# Patient Record
Sex: Female | Born: 1990
Health system: Southern US, Community
[De-identification: ages and names within clinical notes are randomized; demographics above are authoritative.]

## PROBLEM LIST (undated history)

## (undated) DIAGNOSIS — R87619 Unspecified abnormal cytological findings in specimens from cervix uteri: Secondary | ICD-10-CM

## (undated) DIAGNOSIS — I499 Cardiac arrhythmia, unspecified: Secondary | ICD-10-CM

## (undated) HISTORY — PX: BREAST SURGERY: SHX581

## (undated) HISTORY — PX: WISDOM TOOTH EXTRACTION: SHX21

---
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
## 2014-12-04 NOTE — L&D Delivery Note (Signed)
Delivery Note At 5:52 AM a viable female was delivered via Vaginal, Spontaneous Delivery (Presentation: ; Occiput Anterior).  APGAR: 8, 9; weight 7 lb 11.8 oz (3510 g).   Placenta status: Intact, Spontaneous.  Cord: 3 vessels with the following complications: None.  Cord pH: none drawn  Anesthesia: Epidural  Episiotomy: None Lacerations: 1st degree;Labial Suture Repair: 3.0 vicryl Est. Blood Loss (mL): 300  Mom to postpartum.  Baby to Couplet care / Skin to Skin.  Called to see patient.  Mom pushed to deliver viable female infant.  The head followed by shoulders, which delivered without difficulty, and the rest of the body.  A single tight nuchal cord noted and delivered through.  Baby to mom's chest.  Cord clamped and cut after > 1 min delay.  No cord blood obtained.  Placenta delivered spontaneously, intact, with a 3-vessel cord.  First degree perineal laceration repaired with 3-0 Vicryl in a single, figure-of-eight suture.  All counts correct.  Hemostasis obtained with IV pitocin and fundal massage. EBL 300 mL.    Conard Novak, MD 07/30/2015, 6:25 AM

## 2015-07-29 ENCOUNTER — Inpatient Hospital Stay: Payer: 59 | Admitting: Anesthesiology

## 2015-07-29 ENCOUNTER — Encounter: Payer: Self-pay | Admitting: Obstetrics and Gynecology

## 2015-07-29 ENCOUNTER — Other Ambulatory Visit: Payer: Self-pay | Admitting: Obstetrics and Gynecology

## 2015-07-29 ENCOUNTER — Inpatient Hospital Stay
Admission: EM | Admit: 2015-07-29 | Discharge: 2015-07-31 | DRG: 774 | Disposition: A | Discharge status: Home / Self Care | Payer: 59 | Attending: Obstetrics and Gynecology | Admitting: Obstetrics and Gynecology

## 2015-07-29 DIAGNOSIS — O9942 Diseases of the circulatory system complicating childbirth: Secondary | ICD-10-CM | POA: Diagnosis present

## 2015-07-29 DIAGNOSIS — Z833 Family history of diabetes mellitus: Secondary | ICD-10-CM | POA: Diagnosis not present

## 2015-07-29 DIAGNOSIS — Z3A39 39 weeks gestation of pregnancy: Secondary | ICD-10-CM | POA: Diagnosis present

## 2015-07-29 DIAGNOSIS — Z8249 Family history of ischemic heart disease and other diseases of the circulatory system: Secondary | ICD-10-CM

## 2015-07-29 HISTORY — DX: Unspecified abnormal cytological findings in specimens from cervix uteri: R87.619

## 2015-07-29 HISTORY — DX: Cardiac arrhythmia, unspecified: I49.9

## 2015-07-29 LAB — TYPE AND SCREEN
ABO/RH(D): A POS
ANTIBODY SCREEN: NEGATIVE

## 2015-07-29 LAB — OB RESULTS CONSOLE GBS: GBS: NEGATIVE

## 2015-07-29 LAB — CBC
HCT: 36.3 % (ref 35.0–47.0)
Hemoglobin: 12 g/dL (ref 12.0–16.0)
MCH: 28.9 pg (ref 26.0–34.0)
MCHC: 33.1 g/dL (ref 32.0–36.0)
MCV: 87.5 fL (ref 80.0–100.0)
PLATELETS: 287 10*3/uL (ref 150–440)
RBC: 4.15 MIL/uL (ref 3.80–5.20)
RDW: 13.9 % (ref 11.5–14.5)
WBC: 15.6 10*3/uL — AB (ref 3.6–11.0)

## 2015-07-29 LAB — OB RESULTS CONSOLE ABO/RH: RH Type: POSITIVE

## 2015-07-29 MED ORDER — CITRIC ACID-SODIUM CITRATE 334-500 MG/5ML PO SOLN: 30.0000 mL | ORAL | Status: DC | PRN

## 2015-07-29 MED ORDER — LACTATED RINGERS IV SOLN
INTRAVENOUS | Status: DC
  Administered 2015-07-29 – 2015-07-30 (×2): via INTRAVENOUS

## 2015-07-29 MED ORDER — OXYTOCIN BOLUS FROM INFUSION
500.0000 mL | INTRAVENOUS | Status: DC
  Administered 2015-07-30: 500 mL via INTRAVENOUS

## 2015-07-29 MED ORDER — FENTANYL CITRATE (PF) 100 MCG/2ML IJ SOLN
INTRAMUSCULAR | Status: AC
  Administered 2015-07-29: 50 ug via INTRAVENOUS
  Filled 2015-07-29: qty 2

## 2015-07-29 MED ORDER — OXYTOCIN 40 UNITS IN LACTATED RINGERS INFUSION - SIMPLE MED
62.5000 mL/h | INTRAVENOUS | Status: DC
  Filled 2015-07-29: qty 1000

## 2015-07-29 MED ORDER — ONDANSETRON HCL 4 MG/2ML IJ SOLN: 4.0000 mg | Freq: Four times a day (QID) | INTRAMUSCULAR | Status: DC | PRN

## 2015-07-29 MED ORDER — OXYTOCIN 10 UNIT/ML IJ SOLN: 10.0000 [IU] | Freq: Once | INTRAMUSCULAR | Status: DC

## 2015-07-29 MED ORDER — FENTANYL CITRATE (PF) 100 MCG/2ML IJ SOLN
50.0000 ug | Freq: Once | INTRAMUSCULAR | Status: AC
  Administered 2015-07-29: 50 ug via INTRAVENOUS

## 2015-07-29 MED ORDER — LACTATED RINGERS IV SOLN: 500.0000 mL | INTRAVENOUS | Status: DC | PRN

## 2015-07-29 MED ORDER — LIDOCAINE HCL (PF) 1 % IJ SOLN
30.0000 mL | INTRAMUSCULAR | Status: DC | PRN
Start: 2015-07-29 — End: 2015-07-30
  Filled 2015-07-29: qty 30

## 2015-07-29 NOTE — H&P (Signed)
OB History & Physical   History of Present Illness:  Chief Complaint: Contractions  HPI:  Hannah Schaefer is a 24 y.o. No obstetric history on file. female at [redacted]w[redacted]d GA dated by a 12-week ultrasound. Her pregnancy has been complicated by a short interpregnancy interval, history of LSIL pap smear.   She reports contractions on and off for the past 24 hours, getting stronger over the last four hours. She denies leakage of fluid. She denies vginal bleeding. She reports fetal movement.   Maternal Medical History:  Past Medical History: Abnormal pap smear (LSIL) Question of cardiac arrhythmia (normal EKG this pregnancy)  Past Surgical History: No surgeries  Allergies: NKDA   Medications at home  PNV   Obstetrical History: G1 05/16/14 40 weeks weight 6 ob 14oz female "Hannah Schaefer" born in [place], [place] current  Prenatal care site: [facility]  Social History: She denies tobacco, current EtOH use, and denies illicit drug use  Family History: Type 2 DM, HTN  Review of Systems: Negative x 10 systems reviewed except as noted in the HPI.   Physical Exam:  Vital Signs: pending General: no acute distress.  HEENT: normocephalic, atraumatic Heart: regular rate & rhythm. No murmurs/rubs/gallops Lungs: clear to auscultation bilaterally Abdomen: soft, gravid, non-tender; EFW: 7 pounds Pelvic:  External: Normal external female genitalia Cervix: Dilation: 3.5 with change to 5.5cm over 2 hours / Effacement (%): 50 / Station: Ballotable  Extremities: non-tender, symmetric, no edema bilaterally. DTRs: 2+  Neurologic: Alert & oriented x 3.   Pertinent Results:  Prenatal Labs: Blood type/Rh A positive  Antibody screen negative  Rubella Immune  RPR Non-Reactive  HBsAg negative  HIV negative  GC negative  Chlamydia negative  Genetic screening 1sst trimester screen negative  1 hour GTT 132   3 hour GTT n/a  GBS negative   Baseline FHR: 130 beats/min Variability: moderate Accelerations: present Decelerations: present (periodic variable decelerations with contractions Contractions: present fr equency: 3q 10 min Overall assessment: category 1 overall  Assessment:  Hannah Schaefer is a 24 y.o. G2P1001 at [redacted]w[redacted]d GA with labor.   Plan: 1.  Admit for labor 2. T&S, CBC, IVF, clears 3. FWB: Reassuring overall 4. GBS negative.     Conard Novak, MD, FACOG 07/29/2015 10:32 PM

## 2015-07-29 NOTE — Anesthesia Preprocedure Evaluation (Signed)
Anesthesia Evaluation  Patient identified by MRN, date of birth, ID band Patient awake    Reviewed: Allergy & Precautions, NPO status , Patient's Chart, lab work & pertinent test results  Airway Mallampati: II  TM Distance: >3 FB Neck ROM: Full    Dental no notable dental hx.    Pulmonary neg pulmonary ROS,    Pulmonary exam normal       Cardiovascular negative cardio ROS Normal cardiovascular exam+ dysrhythmias     Neuro/Psych negative neurological ROS  negative psych ROS   GI/Hepatic negative GI ROS, Neg liver ROS,   Endo/Other  negative endocrine ROS  Renal/GU negative Renal ROS     Musculoskeletal negative musculoskeletal ROS (+)   Abdominal Normal abdominal exam  (+)   Peds negative pediatric ROS (+)  Hematology negative hematology ROS (+) REFUSES BLOOD PRODUCTS,   Anesthesia Other Findings   Reproductive/Obstetrics (+) Pregnancy                             Anesthesia Physical Anesthesia Plan  ASA: II  Anesthesia Plan: Epidural   Post-op Pain Management:    Induction: Intravenous  Airway Management Planned:   Additional Equipment:   Intra-op Plan:   Post-operative Plan:   Informed Consent: I have reviewed the patients History and Physical, chart, labs and discussed the procedure including the risks, benefits and alternatives for the proposed anesthesia with the patient or authorized representative who has indicated his/her understanding and acceptance.   Dental advisory given  Plan Discussed with: CRNA and Surgeon  Anesthesia Plan Comments:         Anesthesia Quick Evaluation

## 2015-07-29 NOTE — OB Triage Provider Note (Signed)
OB History & Physical   History of Present Illness:  Chief Complaint: Contractions  HPI:  Hannah Schaefer is a 24 y.o. No obstetric history on file. female at [redacted]w[redacted]d GA dated by a 12-week ultrasound.  Her pregnancy has been complicated by a short interpregnancy interval, history of LSIL pap smear.    She reports contractions on and off for the past 24 hours, getting stronger over the last four hours.   She denies leakage of fluid.   She denies vginal bleeding.   She reports fetal movement.    Maternal Medical History:  Past Medical History: Abnormal pap smear (LSIL) Question of cardiac arrhythmia (normal EKG this pregnancy)  Past Surgical History: No surgeries  Allergies: NKDA   Medications at home  PNV   Obstetrical History: G1 05/16/14 40 weeks weight 6 ob 14oz female "[name]" born in [place], [place] current  Prenatal care site: [facility]  Social History: She denies tobacco, current EtOH use, and denies illicit drug use  Family History: Type 2 DM, HTN  Review of Systems: Negative x 10 systems reviewed except as noted in the HPI.    Physical Exam:  Vital Signs: pending General: no acute distress.  HEENT: normocephalic, atraumatic Heart: regular rate & rhythm.  No murmurs/rubs/gallops Lungs: clear to auscultation bilaterally Abdomen: soft, gravid, non-tender;  EFW: 7 pounds Pelvic:   External: Normal external female genitalia  Cervix: Dilation: 3.5 / Effacement (%): 50 / Station: Ballotable    Extremities: non-tender, symmetric, no edema bilaterally.  DTRs: 2+  Neurologic: Alert & oriented x 3.    Pertinent Results:  Prenatal Labs: Blood type/Rh A positive  Antibody screen negative  Rubella Immune  RPR Non-Reactive  HBsAg negative  HIV negative  GC negative  Chlamydia negative  Genetic screening 1sst trimester screen negative  1 hour GTT 132  3 hour GTT n/a  GBS negative   Baseline FHR: 130 beats/min   Variability: moderate   Accelerations:  present   Decelerations: present (periodic variable decelerations with contractions Contractions: present fr equency: 3q 10 min Overall assessment: category 1 overall  Assessment:  Hannah Schaefer is a 24 y.o. G2P1001 at [redacted]w[redacted]d GA with contractions.   Plan:  1. Labor; monitor for change 2. FWB: Reassuring overall 3. GBS negative.   Conard Novak, MD 07/29/2015 10:00 PM

## 2015-07-30 ENCOUNTER — Encounter: Payer: Self-pay | Admitting: Obstetrics and Gynecology

## 2015-07-30 MED ORDER — ONDANSETRON HCL 4 MG/2ML IJ SOLN: 4.0000 mg | INTRAMUSCULAR | Status: DC | PRN

## 2015-07-30 MED ORDER — CALCIUM CARBONATE ANTACID 500 MG PO CHEW: 1.0000 | CHEWABLE_TABLET | Freq: Once | ORAL | Status: DC

## 2015-07-30 MED ORDER — PHENYLEPHRINE 40 MCG/ML (10ML) SYRINGE FOR IV PUSH (FOR BLOOD PRESSURE SUPPORT)
80.0000 ug | PREFILLED_SYRINGE | INTRAVENOUS | Status: DC | PRN
  Filled 2015-07-30: qty 2

## 2015-07-30 MED ORDER — BENZOCAINE-MENTHOL 20-0.5 % EX AERO
1.0000 "application " | INHALATION_SPRAY | CUTANEOUS | Status: DC | PRN
  Filled 2015-07-30 (×2): qty 56

## 2015-07-30 MED ORDER — LIDOCAINE-EPINEPHRINE (PF) 1.5 %-1:200000 IJ SOLN
INTRAMUSCULAR | Status: DC | PRN
  Administered 2015-07-30: 3 mL via PERINEURAL

## 2015-07-30 MED ORDER — HYDROCODONE-ACETAMINOPHEN 5-325 MG PO TABS
2.0000 | ORAL_TABLET | ORAL | Status: DC | PRN
  Administered 2015-07-30: 2 via ORAL
  Filled 2015-07-30: qty 2

## 2015-07-30 MED ORDER — DIPHENHYDRAMINE HCL 25 MG PO CAPS: 25.0000 mg | ORAL_CAPSULE | Freq: Four times a day (QID) | ORAL | Status: DC | PRN

## 2015-07-30 MED ORDER — HYDROCODONE-ACETAMINOPHEN 5-325 MG PO TABS: 1.0000 | ORAL_TABLET | ORAL | Status: DC | PRN

## 2015-07-30 MED ORDER — OXYTOCIN 40 UNITS IN LACTATED RINGERS INFUSION - SIMPLE MED: 62.5000 mL/h | INTRAVENOUS | Status: DC | PRN

## 2015-07-30 MED ORDER — BUPIVACAINE HCL (PF) 0.25 % IJ SOLN
INTRAMUSCULAR | Status: DC | PRN
  Administered 2015-07-30: 10 mL

## 2015-07-30 MED ORDER — DIPHENHYDRAMINE HCL 50 MG/ML IJ SOLN: 12.5000 mg | INTRAMUSCULAR | Status: DC | PRN

## 2015-07-30 MED ORDER — EPHEDRINE 5 MG/ML INJ
10.0000 mg | INTRAVENOUS | Status: DC | PRN
  Filled 2015-07-30: qty 2

## 2015-07-30 MED ORDER — ACETAMINOPHEN 325 MG PO TABS: 650.0000 mg | ORAL_TABLET | ORAL | Status: DC | PRN

## 2015-07-30 MED ORDER — DIBUCAINE 1 % RE OINT: 1.0000 "application " | TOPICAL_OINTMENT | RECTAL | Status: DC | PRN

## 2015-07-30 MED ORDER — SIMETHICONE 80 MG PO CHEW: 80.0000 mg | CHEWABLE_TABLET | ORAL | Status: DC | PRN

## 2015-07-30 MED ORDER — LANOLIN HYDROUS EX OINT: TOPICAL_OINTMENT | CUTANEOUS | Status: DC | PRN

## 2015-07-30 MED ORDER — FENTANYL 2.5 MCG/ML W/ROPIVACAINE 0.2% IN NS 100 ML EPIDURAL INFUSION (ARMC-ANES): 10.0000 mL/h | EPIDURAL | Status: DC

## 2015-07-30 MED ORDER — SENNOSIDES-DOCUSATE SODIUM 8.6-50 MG PO TABS
2.0000 | ORAL_TABLET | ORAL | Status: DC
  Administered 2015-07-30: 2 via ORAL
  Filled 2015-07-30 (×2): qty 2

## 2015-07-30 MED ORDER — ONDANSETRON HCL 4 MG PO TABS: 4.0000 mg | ORAL_TABLET | ORAL | Status: DC | PRN

## 2015-07-30 MED ORDER — WITCH HAZEL-GLYCERIN EX PADS: 1.0000 "application " | MEDICATED_PAD | CUTANEOUS | Status: DC | PRN

## 2015-07-30 MED ORDER — CALCIUM CARBONATE ANTACID 500 MG PO CHEW
CHEWABLE_TABLET | ORAL | Status: AC
  Administered 2015-07-30: 200 mg
  Filled 2015-07-30: qty 1

## 2015-07-30 MED ORDER — IBUPROFEN 600 MG PO TABS
600.0000 mg | ORAL_TABLET | Freq: Four times a day (QID) | ORAL | Status: DC
  Administered 2015-07-30 – 2015-07-31 (×5): 600 mg via ORAL
  Filled 2015-07-30 (×5): qty 1

## 2015-07-30 MED ORDER — PRENATAL MULTIVITAMIN CH
1.0000 | ORAL_TABLET | Freq: Every day | ORAL | Status: DC
  Administered 2015-07-30 – 2015-07-31 (×2): 1 via ORAL
  Filled 2015-07-30 (×2): qty 1

## 2015-07-30 MED ORDER — FERROUS SULFATE 325 (65 FE) MG PO TABS
325.0000 mg | ORAL_TABLET | Freq: Two times a day (BID) | ORAL | Status: DC
  Administered 2015-07-30 – 2015-07-31 (×2): 325 mg via ORAL
  Filled 2015-07-30 (×2): qty 1

## 2015-07-30 MED ORDER — FENTANYL 2.5 MCG/ML W/ROPIVACAINE 0.2% IN NS 100 ML EPIDURAL INFUSION (ARMC-ANES)
EPIDURAL | Status: AC
  Administered 2015-07-30: 10 mL/h via EPIDURAL
  Filled 2015-07-30: qty 100

## 2015-07-30 NOTE — Anesthesia Postprocedure Evaluation (Addendum)
  Anesthesia Post-op Note  Patient: Hannah Schaefer  Procedure(s) Performed: CLE  Anesthesia type:Epidural  Patient location: LDR 1  Post pain: Pain level controlled  Post assessment: Post-op Vital signs reviewed, Patient's Cardiovascular Status Stable, Respiratory Function Stable, Patent Airway and No signs of Nausea or vomiting  Post vital signs: Reviewed and stable  Last Vitals:  Filed Vitals:   07/30/15 0715  BP:   Pulse:   Temp: 36.8 C  Resp: 18    Level of consciousness: awake, alert  and patient cooperative  Complications: No apparent anesthesia complications

## 2015-07-30 NOTE — Addendum Note (Signed)
Addendum  created 07/30/15 0756 by Irving Burton, CRNA   Modules edited: Notes Section   Notes Section:  File: [id number]

## 2015-07-30 NOTE — Anesthesia Procedure Notes (Signed)
Epidural Patient location during procedure: OB Start time: 07/29/2015 11:54 PM End time: 07/30/2015 12:02 AM  Staffing Anesthesiologist: Yves Dill Performed by: anesthesiologist   Preanesthetic Checklist Completed: patient identified, site marked, surgical consent, pre-op evaluation, timeout performed, IV checked, risks and benefits discussed and monitors and equipment checked  Epidural Patient position: sitting Prep: Betadine and site prepped and draped Patient monitoring: heart rate, cardiac monitor, continuous pulse ox and blood pressure Approach: midline Location: L3-L4 Injection technique: LOR air  Needle:  Needle type: Tuohy  Needle gauge: 18 G Needle length: 9 cm Catheter type: closed end flexible Catheter size: 20 Guage Test dose: 1.5% lidocaine with Epi 1:200 K  Assessment Sensory level: T8  Additional Notes Time out called.  Patient placed in sitting position.  Prepped and draped in sterile fashion.  A skin wheal was made in the L3-L4 interspace with 1% Lidocaine plain.  A Tuohy needle was advanced to the epidural space by loss of  Resistance technique.  Catheter was threaded 3cm with negative TD.  Patient tolerated the procedure well.Reason for block:procedure for pain

## 2015-07-30 NOTE — Progress Notes (Signed)
L&D Note  07/30/2015 - 5:25 AM   Subjective:  Comfortable with epidural.  Reportedly complete for a while  Objective:   Filed Vitals:   07/30/15 0029 07/30/15 0044 07/30/15 0059 07/30/15 0200  BP: 107/43 111/66 111/51 87/38  Pulse: 80 93 74 84  Temp:      TempSrc:      Resp:      Height:      Weight:        Current Vital Signs 24h Vital Sign Ranges  T 98.2 F (36.8 C) Temp  Avg: 98.2 F (36.8 C)  Min: 98.2 F (36.8 C)  Max: 98.2 F (36.8 C)  BP (!) 87/38 mmHg BP  Min: 87/38  Max: 118/73  HR 84 Pulse  Avg: 85.5  Min: 74  Max: 111  RR 16 Resp  Avg: 16  Min: 16  Max: 16  SaO2     No Data Recorded       24 Hour I/O Current Shift I/O  Time Ins Outs        FHR: 140/mod var/+accesl/variable decel with contraction Toco: 3-4 q 10 min Gen: NAD SVE: complete, bulging bag.  AROM for mod meconium  Labs:   Recent Labs Lab 07/29/15 2253  WBC 15.6*  HGB 12.0  HCT 36.3  PLT 287   Medications SCHEDULED MEDICATIONS  . calcium carbonate  1 tablet Oral Once  . oxytocin  10 Units Intramuscular Once    MEDICATION INFUSIONS  . fentanyl 2.5 mcg/ml w/ropivacaine 0.2% in normal saline 100 mL EPIDURAL Infusion    . lactated ringers 125 mL/hr at 07/30/15 0306  . oxytocin 40 units in LR 1000 mL    . oxytocin 40 units in LR 1000 mL      PRN MEDICATIONS  citric acid-sodium citrate, diphenhydrAMINE, ePHEDrine, lactated ringers, lidocaine (PF), ondansetron, phenylephrine   Assessment & Plan:  24 y.o. G2P1001 at [redacted]w[redacted]d GA. In labor.  *IUP: labor; AROM at about -2 station.  Down to zero station after arom. Expect vaginal delivery. *FWB: reassuring overall.   *GBS: neg *Analgesia:epidural  Conard Novak  07/30/2015 5:25 AM  [facility]

## 2015-07-31 LAB — CBC
HEMATOCRIT: 34.5 % — AB (ref 35.0–47.0)
Hemoglobin: 11.4 g/dL — ABNORMAL LOW (ref 12.0–16.0)
MCH: 29.3 pg (ref 26.0–34.0)
MCHC: 33.1 g/dL (ref 32.0–36.0)
MCV: 88.5 fL (ref 80.0–100.0)
Platelets: 226 10*3/uL (ref 150–440)
RBC: 3.9 MIL/uL (ref 3.80–5.20)
RDW: 13.8 % (ref 11.5–14.5)
WBC: 10.8 10*3/uL (ref 3.6–11.0)

## 2015-07-31 LAB — CHLAMYDIA/NGC RT PCR (ARMC ONLY)
CHLAMYDIA TR: NOT DETECTED
N gonorrhoeae: NOT DETECTED

## 2015-07-31 LAB — RPR: RPR: NONREACTIVE

## 2015-07-31 MED ORDER — IBUPROFEN 600 MG PO TABS: 600.0000 mg | ORAL_TABLET | Freq: Four times a day (QID) | ORAL | Status: DC | PRN

## 2015-07-31 MED ORDER — HYDROCODONE-ACETAMINOPHEN 5-325 MG PO TABS: 1.0000 | ORAL_TABLET | ORAL | Status: DC | PRN

## 2015-07-31 MED ORDER — LANOLIN HYDROUS EX OINT: 1.0000 "application " | TOPICAL_OINTMENT | CUTANEOUS | Status: DC | PRN

## 2015-07-31 MED ORDER — BENZOCAINE-MENTHOL 20-0.5 % EX AERO: 1.0000 "application " | INHALATION_SPRAY | Freq: Four times a day (QID) | CUTANEOUS | Status: DC | PRN

## 2015-07-31 MED ORDER — PRENATAL MULTIVITAMIN CH: 1.0000 | ORAL_TABLET | Freq: Every day | ORAL | Status: AC

## 2015-07-31 NOTE — Discharge Summary (Signed)
Physician Obstetric Discharge Summary  Patient ID: Verniece Encarnacion MRN: [id number] DOB/AGE: January 06, 1991 24 y.o.   Date of Admission: 07/29/2015  Date of Discharge: 07/31/2015  Admitting Diagnosis: Onset of Labor  At 39 weeks  Secondary Diagnosis: none  Mode of Delivery: normal spontaneous vaginal delivery on 30 July 2015       Discharge Diagnosis: Term intrauterine pregnancy-delivered at 39 weeks   Intrapartum Procedures: Atificial rupture of membranes and repair of first degree labial laceration, epidural   Post partum procedures: none  Complications: none   Brief Hospital Course  Ferrah Panagopoulos is a [id number] who had a SVD on 30 July 2015;  for further details of this delivery, please refer to the delivery note.  Patient had an uncomplicated postpartum course.  By time of discharge on PPD#1, her pain was controlled on oral pain medications; she had appropriate lochia and was ambulating, voiding without difficulty and tolerating regular diet.  She was deemed stable for discharge to home.    Labs: CBC Latest Ref Rng 07/31/2015 07/29/2015  WBC 3.6 - 11.0 K/uL 10.8 15.6(H)  Hemoglobin 12.0 - 16.0 g/dL 11.4(L) 12.0  Hematocrit 35.0 - 47.0 % 34.5(L) 36.3  Platelets 150 - 440 K/uL 226 287   A POS  Physical exam:  Blood pressure 104/54, pulse 60, temperature 97.7 F (36.5 C), temperature source Oral, resp. rate 16, height  (1.651 m), weight 168 lb (76.204 kg), SpO2 98 %, unknown if currently breastfeeding. General: alert and no distress Lochia: appropriate Abdomen: soft, NT Uterine Fundus: firm at U-1/ML/NT Extremities: No evidence of DVT seen on physical exam. No lower extremity edema.  Discharge Instructions: Per After Visit Summary. Activity: Advance as tolerated. Pelvic rest for 6 weeks.  Also refer to Discharge Instructions Diet: Regular Medications:   Medication List    TAKE these medications        benzocaine-Menthol 20-0.5 % Aero  Commonly  known as:  DERMOPLAST  Apply 1 application topically 4 (four) times daily as needed for irritation (perineal discomfort).     HYDROcodone-acetaminophen 5-325 MG per tablet  Commonly known as:  NORCO/VICODIN  Take 1 tablet by mouth every 4 (four) hours as needed for moderate pain (pain scale score 4-7 out of 10).     ibuprofen 600 MG tablet  Commonly known as:  ADVIL,MOTRIN  Take 1 tablet (600 mg total) by mouth every 6 (six) hours as needed.     lanolin Oint  Apply 1 application topically as needed (for breast care).     prenatal multivitamin Tabs tablet  Take 1 tablet by mouth daily at 12 noon.       Outpatient follow up:  Follow-up Information    Follow up with Conard Novak, MD. Call in 6 weeks.   Specialty:  Obstetrics and Gynecology   Why:  6 week postpartum visit and IUD insertion   Contact information:   [address] [place] [address] [phone]      Postpartum contraception: IUD  Discharged Condition: good  Discharged to: home   Newborn Data: Disposition:home with mother  Apgars: APGAR (1 MIN): 8   APGAR (5 MINS): 9   APGAR (10 MINS):    Baby Feeding: Breast/ Nila Nephew, CNM 07/31/2015 12:25 PM

## 2015-07-31 NOTE — Discharge Instructions (Signed)
° °Vaginal Delivery, Care After °Refer to this sheet in the next few weeks. These discharge instructions provide you with information on caring for yourself after delivery. Your caregiver may also give you specific instructions. Your treatment has been planned according to the most current medical practices available, but problems sometimes occur. Call your caregiver if you have any problems or questions after you go home. °HOME CARE INSTRUCTIONS °1. Take over-the-counter or prescription medicines only as directed by your caregiver or pharmacist. °2. Do not drink alcohol, especially if you are breastfeeding or taking medicine to relieve pain. °3. Do not smoke tobacco. °4. Continue to use good perineal care. Good perineal care includes: °1. Wiping your perineum from back to front °2. Keeping your perineum clean. °3. You can do sitz baths twice a day, to help keep this area clean °5. Do not use tampons, douche or have sex until your caregiver says it is okay. °6. Shower only and avoid sitting in submerged water, aside from sitz baths °7. Wear a well-fitting bra that provides breast support. °8. Eat healthy foods. °9. Drink enough fluids to keep your urine clear or pale yellow. °10. Eat high-fiber foods such as whole grain cereals and breads, brown rice, beans, and fresh fruits and vegetables every day. These foods may help prevent or relieve constipation. °11. Avoid constipation with high fiber foods or medications, such as miralax or metamucil °12. Follow your caregiver's recommendations regarding resumption of activities such as climbing stairs, driving, lifting, exercising, or traveling. °13. Talk to your caregiver about resuming sexual activities. Resumption of sexual activities is dependent upon your risk of infection, your rate of healing, and your comfort and desire to resume sexual activity. °14. Try to have someone help you with your household activities and your newborn for at least a few days after you  leave the hospital. °15. Rest as much as possible. Try to rest or take a nap when your newborn is sleeping. °16. Increase your activities gradually. °17. Keep all of your scheduled postpartum appointments. It is very important to keep your scheduled follow-up appointments. At these appointments, your caregiver will be checking to make sure that you are healing physically and emotionally. °SEEK MEDICAL CARE IF:  °· You are passing large clots from your vagina. Save any clots to show your caregiver. °· You have a foul smelling discharge from your vagina. °· You have trouble urinating. °· You are urinating frequently. °· You have pain when you urinate. °· You have a change in your bowel movements. °· You have increasing redness, pain, or swelling near your vaginal incision (episiotomy) or vaginal tear. °· You have pus draining from your episiotomy or vaginal tear. °· Your episiotomy or vaginal tear is separating. °· You have painful, hard, or reddened breasts. °· You have a severe headache. °· You have blurred vision or see spots. °· You feel sad or depressed. °· You have thoughts of hurting yourself or your newborn. °· You have questions about your care, the care of your newborn, or medicines. °· You are dizzy or light-headed. °· You have a rash. °· You have nausea or vomiting. °· You were breastfeeding and have not had a menstrual period within 12 weeks after you stopped breastfeeding. °· You are not breastfeeding and have not had a menstrual period by the 12th week after delivery. °· You have a fever. °SEEK IMMEDIATE MEDICAL CARE IF:  °· You have persistent pain. °· You have chest pain. °· You have shortness of breath. °·   You faint. °· You have leg pain. °· You have stomach pain. °· Your vaginal bleeding saturates two or more sanitary pads in 1 hour. °MAKE SURE YOU:  °· Understand these instructions. °· Will watch your condition. °· Will get help right away if you are not doing well or get worse. °Document Released:  11/17/2000 Document Revised: 04/06/2014 Document Reviewed: 07/17/2012 °[employer]® Patient Information ©2015 [employer]. This information is not intended to replace advice given to you by your health care provider. Make sure you discuss any questions you have with your health care provider. ° °Sitz Bath °A sitz bath is a warm water bath taken in the sitting position. The water covers only the hips and butt (buttocks). We recommend using one that fits in the toilet, to help with ease of use and cleanliness. It may be used for either healing or cleaning purposes. Sitz baths are also used to relieve pain, itching, or muscle tightening (spasms). The water may contain medicine. Moist heat will help you heal and relax.  °HOME CARE  °Take 3 to 4 sitz baths a day. °18. Fill the bathtub half-full with warm water. °19. Sit in the water and open the drain a little. °20. Turn on the warm water to keep the tub half-full. Keep the water running constantly. °21. Soak in the water for 15 to 20 minutes. °22. After the sitz bath, pat the affected area dry. °GET HELP RIGHT AWAY IF: °You get worse instead of better. Stop the sitz baths if you get worse. °MAKE SURE YOU: °· Understand these instructions. °· Will watch your condition. °· Will get help right away if you are not doing well or get worse. °Document Released: 12/28/2004 Document Revised: 08/14/2012 Document Reviewed: 03/20/2011 °[employer]® Patient Information ©2015 [employer]. This information is not intended to replace advice given to you by your health care provider. Make sure you discuss any questions you have with your health care provider. ° ° °Follow up sooner with fever, problems breathing, pain not helped by medications, severe depression( more than just baby blues, wanting to hurt yourself or the baby), severe bleeding ( saturating more than one pad an hour or large palm sized clots), no heavy lifting , no driving while taking narcotics, no douches, intercourse,  tampons or enemas for 6 weeks  °

## 2015-07-31 NOTE — Progress Notes (Signed)
All discharge instructions given to patient and she voiced understanding of all instructions given. Prescriptions given. She will make her 6 wk f/u appt.  Pt discharged home escorted out in wheelchair with infant in her arms.

## 2015-11-01 ENCOUNTER — Other Ambulatory Visit: Payer: Self-pay | Admitting: Obstetrics and Gynecology

## 2015-11-01 DIAGNOSIS — R1011 Right upper quadrant pain: Secondary | ICD-10-CM

## 2015-11-01 DIAGNOSIS — R1013 Epigastric pain: Secondary | ICD-10-CM

## 2015-11-04 ENCOUNTER — Ambulatory Visit
Admission: RE | Admit: 2015-11-04 | Discharge: 2015-11-04 | Disposition: A | Discharge status: Home / Self Care | Payer: 59 | Source: Ambulatory Visit | Attending: Obstetrics and Gynecology | Admitting: Obstetrics and Gynecology

## 2015-11-04 DIAGNOSIS — R1013 Epigastric pain: Secondary | ICD-10-CM

## 2015-11-04 DIAGNOSIS — K8020 Calculus of gallbladder without cholecystitis without obstruction: Secondary | ICD-10-CM | POA: Insufficient documentation

## 2015-11-04 DIAGNOSIS — R1011 Right upper quadrant pain: Secondary | ICD-10-CM | POA: Diagnosis present

## 2015-11-09 ENCOUNTER — Encounter: Payer: Self-pay | Admitting: *Deleted

## 2015-11-17 ENCOUNTER — Ambulatory Visit (INDEPENDENT_AMBULATORY_CARE_PROVIDER_SITE_OTHER): Payer: 59 | Admitting: General Surgery

## 2015-11-17 VITALS — BP 116/84 | HR 82 | Resp 12 | Ht 65.0 in | Wt 141.0 lb

## 2015-11-17 DIAGNOSIS — K8020 Calculus of gallbladder without cholecystitis without obstruction: Secondary | ICD-10-CM | POA: Diagnosis not present

## 2015-11-17 NOTE — Progress Notes (Signed)
Patient ID: Hannah Schaefer, female   DOB: 09-03-1991, 24 y.o.   MRN: [id number]  Chief Complaint  Patient presents with  . Abdominal Pain    HPI Hannah RimJessica Lauren Schaefer is a 24 y.o. female here today for a evaluation of gallstones.  She states the pain is located in her right upper quadrant and moves to her back. The pain last for about two hours each time. Her first attract was in October 2016 . She states she has pain every other week. Patient had a ultrasound done on 11/04/15.   The patient is a mother of 2, her youngest 214 in August. She is still breast-feeding. She has not needed to make use of the hydrocodone provided more than a couple of times. Episodes are most likely to occur in the early morning hours, 2-3 AM. The patient has found that she uses troublesome in particular, but symptoms will not develop for several hours after eating. Episodes of vomiting have not showed undigested food but yellow colored fluid.  The patient is the granddaughter of a retired Designer, industrial/productanesthesiologist, Garen Lahonald Mundy, M.D. HPI  Past Medical History  Diagnosis Date  . Abnormal Pap smear of cervix   . Arrhythmia     normal sinus rhthym on EKG    Past Surgical History  Procedure Laterality Date  . No past surgeries      No family history on file.  Social History Social History  Substance Use Topics  . Smoking status: Never Smoker   . Smokeless tobacco: Never Used  . Alcohol Use: No    No Known Allergies  Current Outpatient Prescriptions  Medication Sig Dispense Refill  . Prenatal Vit-Fe Fumarate-FA (PRENATAL MULTIVITAMIN) TABS tablet Take 1 tablet by mouth daily at 12 noon.     No current facility-administered medications for this visit.    Review of Systems Review of Systems  Constitutional: Negative.   Respiratory: Negative.   Cardiovascular: Negative.   Gastrointestinal: Positive for nausea, vomiting and abdominal pain.    Blood pressure 116/84, pulse 82, resp. rate 12, height  5\' 5"  (1.651 m), weight 141 lb (63.957 kg), last menstrual period 11/17/2015, unknown if currently breastfeeding.  Physical Exam Physical Exam  Constitutional: She is oriented to person, place, and time. She appears well-developed and well-nourished.  Eyes: Conjunctivae are normal. No scleral icterus.  Neck: Neck supple.  Cardiovascular: Normal rate, regular rhythm and normal heart sounds.   Pulmonary/Chest: Effort normal and breath sounds normal.  Abdominal: Soft. Normal appearance and bowel sounds are normal. There is no hepatomegaly. There is tenderness in the epigastric area.  Lymphadenopathy:    She has no cervical adenopathy.  Neurological: She is alert and oriented to person, place, and time.  Skin: Skin is warm and dry.    Data Reviewed Abdominal ultrasound completed on 11/04/2015 was entered bluntly reviewed. Multiple gallstones. No evidence of acute cholecystitis. Common bile duct: 5 mm.  Assessment    Symptomatic cholelithiasis.    Plan        Laparoscopic Cholecystectomy with Intraoperative Cholangiogram. The procedure, including it's potential risks and complications (including but not limited to infection, bleeding, injury to intra-abdominal organs or bile ducts, bile leak, poor cosmetic result, sepsis and death) were discussed with the patient in detail. Non-operative options, including their inherent risks (acute calculous cholecystitis with possible choledocholithiasis or gallstone pancreatitis, with the risk of ascending cholangitis, sepsis, and death) were discussed as well. The patient expressed and understanding of what we discussed and wishes to proceed  with laparoscopic cholecystectomy. The patient further understands that if it is technically not possible, or it is unsafe to proceed laparoscopically, that I will convert to an open cholecystectomy.  Activity restrictions after surgery were reviewed, primarily not lifting her 78-month-old child without assistance.  He will be able to nurse 24 hours after anesthesia. She should have an adequate supply of milk in the freezer to cover this interval time.   Patient's surgery has been scheduled for 11-24-15 at [facility].  PCP:  No Pcp  Ref. Dr. Pearson Grippe, Merrily Pew 11/18/2015, 6:52 AM

## 2015-11-17 NOTE — Patient Instructions (Addendum)
Laparoscopic Cholecystectomy Laparoscopic cholecystectomy is surgery to remove the gallbladder. The gallbladder is located in the upper right part of the abdomen, behind the liver. It is a storage sac for bile, which is produced in the liver. Bile aids in the digestion and absorption of fats. Cholecystectomy is often done for inflammation of the gallbladder (cholecystitis). This condition is usually caused by a buildup of gallstones (cholelithiasis) in the gallbladder. Gallstones can block the flow of bile, and that can result in inflammation and pain. In severe cases, emergency surgery may be required. If emergency surgery is not required, you will have time to prepare for the procedure. Laparoscopic surgery is an alternative to open surgery. Laparoscopic surgery has a shorter recovery time. Your common bile duct may also need to be examined during the procedure. If stones are found in the common bile duct, they may be removed. LET YOUR HEALTH CARE PROVIDER KNOW ABOUT:  Any allergies you have.  All medicines you are taking, including vitamins, herbs, eye drops, creams, and over-the-counter medicines.  Previous problems you or members of your family have had with the use of anesthetics.  Any blood disorders you have.  Previous surgeries you have had.  Any medical conditions you have. RISKS AND COMPLICATIONS Generally, this is a safe procedure. However, problems may occur, including:  Infection.  Bleeding.  Allergic reactions to medicines.  Damage to other structures or organs.  A stone remaining in the common bile duct.  A bile leak from the cyst duct that is clipped when your gallbladder is removed.  The need to convert to open surgery, which requires a larger incision in the abdomen. This may be necessary if your surgeon thinks that it is not safe to continue with a laparoscopic procedure. BEFORE THE PROCEDURE  Ask your health care provider about:  Changing or stopping your  regular medicines. This is especially important if you are taking diabetes medicines or blood thinners.  Taking medicines such as aspirin and ibuprofen. These medicines can thin your blood. Do not take these medicines before your procedure if your health care provider instructs you not to.  Follow instructions from your health care provider about eating or drinking restrictions.  Let your health care provider know if you develop a cold or an infection before surgery.  Plan to have someone take you home after the procedure.  Ask your health care provider how your surgical site will be marked or identified.  You may be given antibiotic medicine to help prevent infection. PROCEDURE  To reduce your risk of infection:  Your health care team will wash or sanitize their hands.  Your skin will be washed with soap.  An IV tube may be inserted into one of your veins.  You will be given a medicine to make you fall asleep (general anesthetic).  A breathing tube will be placed in your mouth.  The surgeon will make several small cuts (incisions) in your abdomen.  A thin, lighted tube (laparoscope) that has a tiny camera on the end will be inserted through one of the small incisions. The camera on the laparoscope will send a picture to a TV screen (monitor) in the operating room. This will give the surgeon a good view inside your abdomen.  A gas will be pumped into your abdomen. This will expand your abdomen to give the surgeon more room to perform the surgery.  Other tools that are needed for the procedure will be inserted through the other incisions. The gallbladder will   be removed through one of the incisions.  After your gallbladder has been removed, the incisions will be closed with stitches (sutures), staples, or skin glue.  Your incisions may be covered with a bandage (dressing). The procedure may vary among health care providers and hospitals. AFTER THE PROCEDURE  Your blood  pressure, heart rate, breathing rate, and blood oxygen level will be monitored often until the medicines you were given have worn off.  You will be given medicines as needed to control your pain.   This information is not intended to replace advice given to you by your health care provider. Make sure you discuss any questions you have with your health care provider.   Document Released: 11/20/2005 Document Revised: 08/11/2015 Document Reviewed: 07/02/2013 Elsevier Interactive Patient Education 2016 [employer]  Patient[facility].

## 2015-11-18 DIAGNOSIS — K8020 Calculus of gallbladder without cholecystitis without obstruction: Secondary | ICD-10-CM | POA: Insufficient documentation

## 2015-11-18 NOTE — H&P (Signed)
HPI  Hannah Schaefer is a 24 y.o. female here today for a evaluation of gallstones. She states the pain is located in her right upper quadrant and moves to her back. The pain last for about two hours each time. Her first attract was in October 2016 . She states she has pain every other week. Patient had a ultrasound done on 11/04/15.  The patient is a mother of 2, her youngest 574 in August. She is still breast-feeding. She has not needed to make use of the hydrocodone provided more than a couple of times. Episodes are most likely to occur in the early morning hours, 2-3 AM. The patient has found that she uses troublesome in particular, but symptoms will not develop for several hours after eating. Episodes of vomiting have not showed undigested food but yellow colored fluid.  The patient is the granddaughter of a retired Designer, industrial/productanesthesiologist, Garen Lahonald Mundy, M.D.  HPI  Past Medical History   Diagnosis  Date   .  Abnormal Pap smear of cervix    .  Arrhythmia      normal sinus rhthym on EKG    Past Surgical History   Procedure  Laterality  Date   .  No past surgeries      No family history on file.  Social History  Social History   Substance Use Topics   .  Smoking status:  Never Smoker   .  Smokeless tobacco:  Never Used   .  Alcohol Use:  No    No Known Allergies  Current Outpatient Prescriptions   Medication  Sig  Dispense  Refill   .  Prenatal Vit-Fe Fumarate-FA (PRENATAL MULTIVITAMIN) TABS tablet  Take 1 tablet by mouth daily at 12 noon.      No current facility-administered medications for this visit.    Review of Systems  Review of Systems  Constitutional: Negative.  Respiratory: Negative.  Cardiovascular: Negative.  Gastrointestinal: Positive for nausea, vomiting and abdominal pain.   Blood pressure 116/84, pulse 82, resp. rate 12, height 5\' 5"  (1.651 m), weight 141 lb (63.957 kg), last menstrual period 11/17/2015, unknown if currently breastfeeding.  Physical Exam   Physical Exam  Constitutional: She is oriented to person, place, and time. She appears well-developed and well-nourished.  Eyes: Conjunctivae are normal. No scleral icterus.  Neck: Neck supple.  Cardiovascular: Normal rate, regular rhythm and normal heart sounds.  Pulmonary/Chest: Effort normal and breath sounds normal.  Abdominal: Soft. Normal appearance and bowel sounds are normal. There is no hepatomegaly. There is tenderness in the epigastric area.  Lymphadenopathy:  She has no cervical adenopathy.  Neurological: She is alert and oriented to person, place, and time.  Skin: Skin is warm and dry.   Data Reviewed  Abdominal ultrasound completed on 11/04/2015 was entered bluntly reviewed. Multiple gallstones. No evidence of acute cholecystitis.  Common bile duct: 5 mm.  Assessment   Symptomatic cholelithiasis.   Plan   Laparoscopic Cholecystectomy with Intraoperative Cholangiogram. The procedure, including it's potential risks and complications (including but not limited to infection, bleeding, injury to intra-abdominal organs or bile ducts, bile leak, poor cosmetic result, sepsis and death) were discussed with the patient in detail. Non-operative options, including their inherent risks (acute calculous cholecystitis with possible choledocholithiasis or gallstone pancreatitis, with the risk of ascending cholangitis, sepsis, and death) were discussed as well. The patient expressed and understanding of what we discussed and wishes to proceed with laparoscopic cholecystectomy. The patient further understands that if it is  technically not possible, or it is unsafe to proceed laparoscopically, that I will convert to an open cholecystectomy.  Activity restrictions after surgery were reviewed, primarily not lifting her 42-month-old child without assistance. He will be able to nurse 24 hours after anesthesia. She should have an adequate supply of milk in the freezer to cover this interval time.   Patient's surgery has been scheduled for 11-24-15 at [facility].  PCP: No Pcp  Ref. Dr. Pearson Grippe, Merrily Pew  11/18/2015, 6:52 AM

## 2015-11-19 ENCOUNTER — Encounter: Payer: Self-pay | Admitting: *Deleted

## 2015-11-19 ENCOUNTER — Other Ambulatory Visit: Payer: 59

## 2015-11-19 NOTE — Patient Instructions (Signed)
  Your procedure is scheduled on: 11-24-15 Report to [facility] 2ND FLOOR To find out your arrival time please call [phone] between 1PM - 3PM on 11-23-15  Remember: Instructions that are not followed completely may result in serious medical risk, up to and including death, or upon the discretion of your surgeon and anesthesiologist your surgery may need to be rescheduled.    _X___ 1. Do not eat food or drink liquids after midnight. No gum chewing or hard candies.     _X___ 2. No Alcohol for 24 hours before or after surgery.   ____ 3. Bring all medications with you on the day of surgery if instructed.    ____ 4. Notify your doctor if there is any change in your medical condition     (cold, fever, infections).     Do not wear jewelry, make-up, hairpins, clips or nail polish.  Do not wear lotions, powders, or perfumes. You may wear deodorant.  Do not shave 48 hours prior to surgery. Men may shave face and neck.  Do not bring valuables to the hospital.    [facility] is not responsible for any belongings or valuables.               Contacts, dentures or bridgework may not be worn into surgery.  Leave your suitcase in the car. After surgery it may be brought to your room.  For patients admitted to the hospital, discharge time is determined by your treatment team.   Patients discharged the day of surgery will not be allowed to drive home.   Please read over the following fact sheets that you were given:      ____ Take these medicines the morning of surgery with A SIP OF WATER:    1. NONE  2.   3.   4.  5.  6.  ____ Fleet Enema (as directed)   ____ Use CHG Soap as directed  ____ Use inhalers on the day of surgery  ____ Stop metformin 2 days prior to surgery    ____ Take 1/2 of usual insulin dose the night before surgery and none on the morning of surgery.   ____ Stop Coumadin/Plavix/aspirin-N/A  ____ Stop Anti-inflammatories   ____ Stop  supplements until after surgery.    ____ Bring C-Pap to the hospital.  

## 2015-11-20 DIAGNOSIS — Z79899 Other long term (current) drug therapy: Secondary | ICD-10-CM | POA: Diagnosis not present

## 2015-11-20 DIAGNOSIS — K8010 Calculus of gallbladder with chronic cholecystitis without obstruction: Secondary | ICD-10-CM | POA: Diagnosis not present

## 2015-11-20 DIAGNOSIS — F17200 Nicotine dependence, unspecified, uncomplicated: Secondary | ICD-10-CM | POA: Diagnosis not present

## 2015-11-20 DIAGNOSIS — K8020 Calculus of gallbladder without cholecystitis without obstruction: Secondary | ICD-10-CM | POA: Diagnosis present

## 2015-11-23 ENCOUNTER — Encounter: Payer: Self-pay | Admitting: *Deleted

## 2015-11-24 ENCOUNTER — Encounter
Admission: RE | Disposition: A | Discharge status: Home / Self Care | Payer: Self-pay | Source: Ambulatory Visit | Attending: General Surgery

## 2015-11-24 ENCOUNTER — Ambulatory Visit: Payer: 59 | Admitting: Anesthesiology

## 2015-11-24 ENCOUNTER — Ambulatory Visit: Payer: 59

## 2015-11-24 ENCOUNTER — Ambulatory Visit
Admission: RE | Admit: 2015-11-24 | Discharge: 2015-11-24 | Disposition: A | Discharge status: Home / Self Care | Payer: 59 | Source: Ambulatory Visit | Attending: General Surgery | Admitting: General Surgery

## 2015-11-24 DIAGNOSIS — K8010 Calculus of gallbladder with chronic cholecystitis without obstruction: Secondary | ICD-10-CM | POA: Diagnosis not present

## 2015-11-24 DIAGNOSIS — Z79899 Other long term (current) drug therapy: Secondary | ICD-10-CM | POA: Insufficient documentation

## 2015-11-24 DIAGNOSIS — K8044 Calculus of bile duct with chronic cholecystitis without obstruction: Secondary | ICD-10-CM | POA: Diagnosis not present

## 2015-11-24 DIAGNOSIS — K8020 Calculus of gallbladder without cholecystitis without obstruction: Secondary | ICD-10-CM

## 2015-11-24 DIAGNOSIS — F17200 Nicotine dependence, unspecified, uncomplicated: Secondary | ICD-10-CM | POA: Insufficient documentation

## 2015-11-24 HISTORY — PX: CHOLECYSTECTOMY: SHX55

## 2015-11-24 LAB — POCT PREGNANCY, URINE: PREG TEST UR: NEGATIVE

## 2015-11-24 SURGERY — LAPAROSCOPIC CHOLECYSTECTOMY WITH INTRAOPERATIVE CHOLANGIOGRAM
Anesthesia: General | Wound class: Clean Contaminated

## 2015-11-24 MED ORDER — FENTANYL CITRATE (PF) 100 MCG/2ML IJ SOLN
25.0000 ug | INTRAMUSCULAR | Status: AC | PRN
Start: 2015-11-24 — End: 2015-11-24
  Administered 2015-11-24 (×6): 25 ug via INTRAVENOUS

## 2015-11-24 MED ORDER — NEOSTIGMINE METHYLSULFATE 10 MG/10ML IV SOLN
INTRAVENOUS | Status: DC | PRN
  Administered 2015-11-24: 3 mg via INTRAVENOUS

## 2015-11-24 MED ORDER — KETOROLAC TROMETHAMINE 30 MG/ML IJ SOLN
INTRAMUSCULAR | Status: DC | PRN
  Administered 2015-11-24: 30 mg via INTRAVENOUS

## 2015-11-24 MED ORDER — HYDROCODONE-ACETAMINOPHEN 5-325 MG PO TABS
ORAL_TABLET | ORAL | Status: AC
  Administered 2015-11-24: 15:00:00
  Filled 2015-11-24: qty 1

## 2015-11-24 MED ORDER — ACETAMINOPHEN 10 MG/ML IV SOLN
INTRAVENOUS | Status: AC
  Filled 2015-11-24: qty 100

## 2015-11-24 MED ORDER — FAMOTIDINE 20 MG PO TABS
20.0000 mg | ORAL_TABLET | Freq: Once | ORAL | Status: AC
  Administered 2015-11-24: 20 mg via ORAL

## 2015-11-24 MED ORDER — LIDOCAINE HCL (CARDIAC) 20 MG/ML IV SOLN
INTRAVENOUS | Status: DC | PRN
  Administered 2015-11-24: 60 mg via INTRAVENOUS

## 2015-11-24 MED ORDER — HYDROCODONE-ACETAMINOPHEN 5-325 MG PO TABS: 1.0000 | ORAL_TABLET | ORAL | Status: DC | PRN

## 2015-11-24 MED ORDER — ONDANSETRON HCL 4 MG/2ML IJ SOLN: 4.0000 mg | Freq: Once | INTRAMUSCULAR | Status: DC | PRN

## 2015-11-24 MED ORDER — ROCURONIUM BROMIDE 100 MG/10ML IV SOLN
INTRAVENOUS | Status: DC | PRN
  Administered 2015-11-24: 30 mg via INTRAVENOUS

## 2015-11-24 MED ORDER — FAMOTIDINE 20 MG PO TABS
ORAL_TABLET | ORAL | Status: AC
  Filled 2015-11-24: qty 1

## 2015-11-24 MED ORDER — SODIUM CHLORIDE 0.9 % IV SOLN
INTRAVENOUS | Status: DC | PRN
  Administered 2015-11-24: 25 mL

## 2015-11-24 MED ORDER — LACTATED RINGERS IV SOLN
INTRAVENOUS | Status: DC
  Administered 2015-11-24: 13:00:00 via INTRAVENOUS

## 2015-11-24 MED ORDER — ACETAMINOPHEN 10 MG/ML IV SOLN
INTRAVENOUS | Status: DC | PRN
  Administered 2015-11-24: 1000 mg via INTRAVENOUS

## 2015-11-24 MED ORDER — FENTANYL CITRATE (PF) 100 MCG/2ML IJ SOLN
INTRAMUSCULAR | Status: DC | PRN
  Administered 2015-11-24: 100 ug via INTRAVENOUS

## 2015-11-24 MED ORDER — PROPOFOL 10 MG/ML IV BOLUS
INTRAVENOUS | Status: DC | PRN
  Administered 2015-11-24: 140 mg via INTRAVENOUS

## 2015-11-24 MED ORDER — FENTANYL CITRATE (PF) 100 MCG/2ML IJ SOLN
INTRAMUSCULAR | Status: AC
  Administered 2015-11-24: 25 ug via INTRAVENOUS
  Filled 2015-11-24: qty 2

## 2015-11-24 MED ORDER — SUCCINYLCHOLINE CHLORIDE 20 MG/ML IJ SOLN
INTRAMUSCULAR | Status: DC | PRN
  Administered 2015-11-24: 80 mg via INTRAVENOUS

## 2015-11-24 MED ORDER — SODIUM CHLORIDE 0.9 % IJ SOLN
INTRAMUSCULAR | Status: AC
  Filled 2015-11-24: qty 50

## 2015-11-24 MED ORDER — ONDANSETRON HCL 4 MG/2ML IJ SOLN
INTRAMUSCULAR | Status: DC | PRN
  Administered 2015-11-24: 4 mg via INTRAVENOUS

## 2015-11-24 MED ORDER — GLYCOPYRROLATE 0.2 MG/ML IJ SOLN
INTRAMUSCULAR | Status: DC | PRN
Start: 2015-11-24 — End: 2015-11-24
  Administered 2015-11-24: 0.6 mg via INTRAVENOUS

## 2015-11-24 MED ORDER — MIDAZOLAM HCL 2 MG/2ML IJ SOLN
INTRAMUSCULAR | Status: DC | PRN
  Administered 2015-11-24: 2 mg via INTRAVENOUS

## 2015-11-24 SURGICAL SUPPLY — 42 items
APPLIER CLIP ROT 10 11.4 M/L (STAPLE) ×3
BENZOIN TINCTURE PRP APPL 2/3 (GAUZE/BANDAGES/DRESSINGS) ×3 IMPLANT
BLADE SURG 11 STRL SS SAFETY (MISCELLANEOUS) ×3 IMPLANT
CANISTER SUCT 1200ML W/VALVE (MISCELLANEOUS) ×3 IMPLANT
CANNULA DILATOR  5MM W/SLV (CANNULA) ×2
CANNULA DILATOR 10 W/SLV (CANNULA) ×2 IMPLANT
CANNULA DILATOR 10MM W/SLV (CANNULA) ×1
CANNULA DILATOR 5 W/SLV (CANNULA) ×4 IMPLANT
CATH CHOLANG 76X19 KUMAR (CATHETERS) ×3 IMPLANT
CHLORAPREP W/TINT 26ML (MISCELLANEOUS) ×3 IMPLANT
CLIP APPLIE ROT 10 11.4 M/L (STAPLE) ×1 IMPLANT
CLOSURE WOUND 1/2 X4 (GAUZE/BANDAGES/DRESSINGS) ×1
CONRAY 60ML FOR OR (MISCELLANEOUS) ×3 IMPLANT
DISSECTOR KITTNER STICK (MISCELLANEOUS) IMPLANT
DISSECTORS/KITTNER STICK (MISCELLANEOUS)
DRAPE SHEET LG 3/4 BI-LAMINATE (DRAPES) ×3 IMPLANT
DRESSING TELFA 4X3 1S ST N-ADH (GAUZE/BANDAGES/DRESSINGS) ×3 IMPLANT
DRSG TEGADERM 2-3/8X2-3/4 SM (GAUZE/BANDAGES/DRESSINGS) ×12 IMPLANT
DRSG TEGADERM 2X2.25 PEDS (GAUZE/BANDAGES/DRESSINGS) ×3 IMPLANT
ENDOPOUCH RETRIEVER 10 (MISCELLANEOUS) IMPLANT
GLOVE BIO SURGEON STRL SZ7.5 (GLOVE) ×12 IMPLANT
GLOVE INDICATOR 8.0 STRL GRN (GLOVE) ×9 IMPLANT
GOWN STRL REUS W/ TWL LRG LVL3 (GOWN DISPOSABLE) ×3 IMPLANT
GOWN STRL REUS W/TWL LRG LVL3 (GOWN DISPOSABLE) ×6
IRRIGATION STRYKERFLOW (MISCELLANEOUS) ×1 IMPLANT
IRRIGATOR STRYKERFLOW (MISCELLANEOUS) ×3
IV LACTATED RINGERS 1000ML (IV SOLUTION) ×3 IMPLANT
KIT RM TURNOVER STRD PROC AR (KITS) ×3 IMPLANT
LABEL OR SOLS (LABEL) ×3 IMPLANT
NDL INSUFF ACCESS 14 VERSASTEP (NEEDLE) ×3 IMPLANT
NS IRRIG 500ML POUR BTL (IV SOLUTION) ×3 IMPLANT
PACK LAP CHOLECYSTECTOMY (MISCELLANEOUS) ×3 IMPLANT
PAD GROUND ADULT SPLIT (MISCELLANEOUS) ×3 IMPLANT
SCISSORS METZENBAUM CVD 33 (INSTRUMENTS) ×3 IMPLANT
SEAL FOR SCOPE WARMER C3101 (MISCELLANEOUS) ×3 IMPLANT
STRIP CLOSURE SKIN 1/2X4 (GAUZE/BANDAGES/DRESSINGS) ×2 IMPLANT
SUT VIC AB 0 CT2 27 (SUTURE) IMPLANT
SUT VIC AB 4-0 FS2 27 (SUTURE) ×3 IMPLANT
SWABSTK COMLB BENZOIN TINCTURE (MISCELLANEOUS) ×3 IMPLANT
TROCAR XCEL NON-BLD 11X100MML (ENDOMECHANICALS) ×3 IMPLANT
TUBING INSUFFLATOR HI FLOW (MISCELLANEOUS) ×3 IMPLANT
WATER STERILE IRR 1000ML POUR (IV SOLUTION) ×3 IMPLANT

## 2015-11-24 NOTE — H&P (Signed)
Hannah Schaefer [id number] 01-26-1991     HPI: Symptomatic cholecystectomy.  Prescriptions prior to admission  Medication Sig Dispense Refill Last Dose  . levonorgestrel (MIRENA) 20 MCG/24HR IUD 1 each by Intrauterine route once.   11/24/2015 at Unknown time  . Prenatal Vit-Fe Fumarate-FA (PRENATAL MULTIVITAMIN) TABS tablet Take 1 tablet by mouth daily at 12 noon.   11/21/2015 at 0800   No Known Allergies Past Medical History  Diagnosis Date  . Abnormal Pap smear of cervix   . Arrhythmia     normal sinus rhthym on EKG-h/o palpitations-pt asymptomatic per pt during 11-19-15 phone interview   Past Surgical History  Procedure Laterality Date  . Wisdom tooth extraction    . Wisdom tooth extraction  2010   Social History   Social History  . Marital Status: Married    Spouse Name: N/A  . Number of Children: N/A  . Years of Education: N/A   Occupational History  . Not on file.   Social History Main Topics  . Smoking status: Never Smoker   . Smokeless tobacco: Never Used  . Alcohol Use: No  . Drug Use: No  . Sexual Activity: Yes    Birth Control/ Protection: IUD   Other Topics Concern  . Not on file   Social History Narrative   Social History   Social History Narrative     ROS: Negative.     PE: HEENT: Negative. Lungs: Clear. Cardio: RR. Hannah MayotteByrnett, Hannah Schaefer 11/24/2015   Assessment/Plan:  Proceed with planned cholecystectomy.

## 2015-11-24 NOTE — Anesthesia Procedure Notes (Signed)
Procedure Name: Intubation Date/Time: 11/24/2015 12:44 PM Performed by: [email or web address], Brexlee Heberlein Pre-anesthesia Checklist: Patient identified, Emergency Drugs available, Timeout performed, Patient being monitored and Suction available Patient Re-evaluated:Patient Re-evaluated prior to inductionOxygen Delivery Method: Circle system utilized Preoxygenation: Pre-oxygenation with 100% oxygen Intubation Type: IV induction and Rapid sequence Laryngoscope Size: Mac and 3 Grade View: Grade I Tube type: Oral Number of attempts: 1 Placement Confirmation: ETT inserted through vocal cords under direct vision,  positive ETCO2,  CO2 detector and breath sounds checked- equal and bilateral Secured at: 21 cm Tube secured with: Tape

## 2015-11-24 NOTE — Anesthesia Preprocedure Evaluation (Signed)
Anesthesia Evaluation  Patient identified by MRN, date of birth, ID band Patient awake    Reviewed: Allergy & Precautions, NPO status , Patient's Chart, lab work & pertinent test results  Airway Mallampati: I       Dental no notable dental hx.    Pulmonary Current Smoker,    Pulmonary exam normal        Cardiovascular negative cardio ROS   Rhythm:Regular     Neuro/Psych negative neurological ROS     GI/Hepatic negative GI ROS, Neg liver ROS,   Endo/Other  negative endocrine ROS  Renal/GU negative Renal ROS     Musculoskeletal   Abdominal Normal abdominal exam  (+)   Peds negative pediatric ROS (+)  Hematology negative hematology ROS (+)   Anesthesia Other Findings   Reproductive/Obstetrics                             Anesthesia Physical Anesthesia Plan  ASA: II  Anesthesia Plan: General   Post-op Pain Management:    Induction: Intravenous  Airway Management Planned: Oral ETT  Additional Equipment:   Intra-op Plan:   Post-operative Plan: Extubation in OR  Informed Consent: I have reviewed the patients History and Physical, chart, labs and discussed the procedure including the risks, benefits and alternatives for the proposed anesthesia with the patient or authorized representative who has indicated his/her understanding and acceptance.     Plan Discussed with: CRNA  Anesthesia Plan Comments:         Anesthesia Quick Evaluation

## 2015-11-24 NOTE — Transfer of Care (Signed)
Immediate Anesthesia Transfer of Care Note  Patient: Hannah Schaefer  Procedure(s) Performed: Procedure(s): LAPAROSCOPIC CHOLECYSTECTOMY WITH INTRAOPERATIVE CHOLANGIOGRAM (N/A)  Patient Location: PACU  Anesthesia Type:General  Level of Consciousness: awake  Airway & Oxygen Therapy: Patient Spontanous Breathing  Post-op Assessment: Report given to RN  Post vital signs: stable  Last Vitals:  Filed Vitals:   11/24/15 1353 11/24/15 1354  BP: 116/74 116/74  Pulse: 79 81  Temp: 36.2 C 36.5 C  Resp: 19 19    Complications: No apparent anesthesia complications

## 2015-11-24 NOTE — Anesthesia Postprocedure Evaluation (Signed)
Anesthesia Post Note  Patient: Hannah Schaefer  Procedure(s) Performed: Procedure(s) (LRB): LAPAROSCOPIC CHOLECYSTECTOMY WITH INTRAOPERATIVE CHOLANGIOGRAM (N/A)  Patient location during evaluation: PACU Anesthesia Type: General Level of consciousness: awake Pain management: satisfactory to patient Vital Signs Assessment: post-procedure vital signs reviewed and stable Respiratory status: spontaneous breathing Cardiovascular status: stable Anesthetic complications: no    Last Vitals:  Filed Vitals:   11/24/15 1353 11/24/15 1354  BP: 116/74 116/74  Pulse: 79 81  Temp: 36.2 C 36.5 C  Resp: 19 19    Last Pain: There were no vitals filed for this visit.               VAN STAVEREN,Noraa Pickeral

## 2015-11-24 NOTE — Op Note (Signed)
Preoperative diagnosis: Chronic cholecystitis and cholelithiasis.  Postoperative diagnosis: Same.  Operative procedure: Laparoscopic cholecystectomy, attempted cholangiograms.  Operating surgeon: Donnalee CurryJeffrey Alverta Caccamo, M.D.  Anesthesia: Gen. endotracheal.  Estimated blood loss: Less than 5 mL.  Clinical note: This 24 year old gravida 2 para 2 woman has developed episodic upper gastric and right upper quadrant pain. Ultrasound showed evidence of cholelithiasis. She is felt to be cancer for elective cholecystectomy.  Operative note: With the patient under adequate general endotracheal anesthesia the abdomen was prepped with ChloraPrep and draped. An Trendelenburg position a varies needle was placed recurrent in the incision. After assuring intra-abdominal location with the hanging drop test the abdomen was insufflated with CO2 10 mmHg pressure. A 10 mm Port was expanded. Inspection showed no evidence of injury from initial port placement. The patient was placed in reverse Trendelenburg position and rolled to the left. An 11 mm XL port was placed in the epigastrium followed by 25 mm Ports placed in the right lower quadrant. The gallbladder showed evidence of mild chronic inflammation. Adhesions between the omentum and the gallbladder taken out with cautery dissection. The gallbladder was placed on cephalad traction. The neck of the gallbladder was cleared and the cystic duct isolated. A Kumar clamp was placed and fluoroscopic cholangiograms attempted. This showed filling of the cystic duct and then no flow into the common bile duct. The backflow into the gallbladder was noted. The catheter was removed. The cystic duct was clipped adjacent to the gallbladder. A single cystic artery was identified as anterior branch of the right hepatic artery doubly clipped and divided. The gallbladder from the liver bed making use of hook cautery dissection. A small rent revealed clear bile but no release stones. This was  controlled with multiple clips. The gallbladder again removed from the liver bed with hook cautery dissection and then delivered to the umbilical port site without incident. The right upper quadrant was irrigated with lactated Ringer solution. The abdomen was then desufflated and ports removed under direct vision. The fascia at the umbilicus was closed with a single 0 Vicryl suture. Skin incisions were closed with 4-0 Vicryl subcutaneous sutures. Benzoin, Steri-Strips, Telfa and Tegaderm dressings were then applied.  The patient tolerated procedure well was taken to recovery in stable condition.

## 2015-11-25 ENCOUNTER — Encounter: Payer: Self-pay | Admitting: General Surgery

## 2015-11-25 LAB — SURGICAL PATHOLOGY

## 2015-12-09 ENCOUNTER — Ambulatory Visit (INDEPENDENT_AMBULATORY_CARE_PROVIDER_SITE_OTHER): Payer: 59 | Admitting: General Surgery

## 2015-12-09 ENCOUNTER — Encounter: Payer: Self-pay | Admitting: General Surgery

## 2015-12-09 VITALS — BP 112/72 | HR 77 | Resp 14 | Ht 65.0 in | Wt 136.0 lb

## 2015-12-09 DIAGNOSIS — K8020 Calculus of gallbladder without cholecystitis without obstruction: Secondary | ICD-10-CM

## 2015-12-09 NOTE — Patient Instructions (Addendum)
Follow up as needed. Call if you have continual problems with diet.

## 2015-12-09 NOTE — Progress Notes (Signed)
Patient ID: Hannah Schaefer, female   DOB: 1991/05/09, 25 y.o.   MRN: [id number]  Chief Complaint  Patient presents with  . Routine Post Op    gallbladder removal    HPI Hannah Schaefer is a 25 y.o. female here for a follow up from gallbladder removal done on 11/24/15. She states she is doing well and only using Ibuprofen for pain relief. She reports some watery diarrhea this week after eating some provolone cheese. She reports no other GI issues.   The patient did pump and dump her breast milk for 24 hours post procedure. She did make use of some herbal medication to resume her normal milk flow post procedure with good result.  I reviewed the patient's history. HPI  Past Medical History  Diagnosis Date  . Abnormal Pap smear of cervix   . Arrhythmia     normal sinus rhthym on EKG-h/o palpitations-pt asymptomatic per pt during 11-19-15 phone interview    Past Surgical History  Procedure Laterality Date  . Wisdom tooth extraction    . Wisdom tooth extraction  2010  . Cholecystectomy N/A 11/24/2015    Procedure: LAPAROSCOPIC CHOLECYSTECTOMY WITH INTRAOPERATIVE CHOLANGIOGRAM;  Surgeon: Earline MayotteJeffrey Schaefer Elsey Holts, MD;  Location: [facility];  Service: General;  Laterality: N/A;    No family history on file.  Social History Social History  Substance Use Topics  . Smoking status: Never Smoker   . Smokeless tobacco: Never Used  . Alcohol Use: No    No Known Allergies  Current Outpatient Prescriptions  Medication Sig Dispense Refill  . levonorgestrel (MIRENA) 20 MCG/24HR IUD 1 each by Intrauterine route once.    . Prenatal Vit-Fe Fumarate-FA (PRENATAL MULTIVITAMIN) TABS tablet Take 1 tablet by mouth daily at 12 noon.     No current facility-administered medications for this visit.    Review of Systems Review of Systems  Constitutional: Negative.   Respiratory: Negative.   Cardiovascular: Negative.     Blood pressure 112/72, pulse 77, resp. rate 14, height 5\' 5"  (1.651 m),  weight 136 lb (61.689 kg), last menstrual period 11/17/2015, currently breastfeeding.  Physical Exam Physical Exam  Constitutional: She is oriented to person, place, and time. She appears well-developed and well-nourished.  Eyes: Conjunctivae are normal. No scleral icterus.  Neck: Neck supple.  Cardiovascular: Normal rate, regular rhythm and normal heart sounds.   Pulmonary/Chest: Effort normal and breath sounds normal.  Abdominal: Soft. Normal appearance.  Small amount of thickening at the umbilical port site.  Lymphadenopathy:    She has no cervical adenopathy.  Neurological: She is alert and oriented to person, place, and time.    Data Reviewed Pathology showed chronic cholecystitis and cholelithiasis.  Assessment    Doing well status post cholecystectomy.    Plan    The patient may increase her activity as tolerated. Proper lifting technique reviewed.    This has been scribed by Sinda Duaryl-Lyn M Kennedy LPN  PCP: No Pcp  Ref. Dr. Pearson GrippeJackson   Hannah Schaefer, Hannah Schaefer 12/11/2015, 9:44 AM

## 2017-07-30 IMAGING — US US ABDOMEN LIMITED
1 series · 14 of 25 positions shown · non-contrast
Comparison: None.

CLINICAL DATA: Right upper quadrant pain for 2 months

EXAM:
US ABDOMEN LIMITED - RIGHT UPPER QUADRANT

[Series 1: us abdomen limited · 0.20mm/px · 14 of 42 slices shown]
[im 1/42]
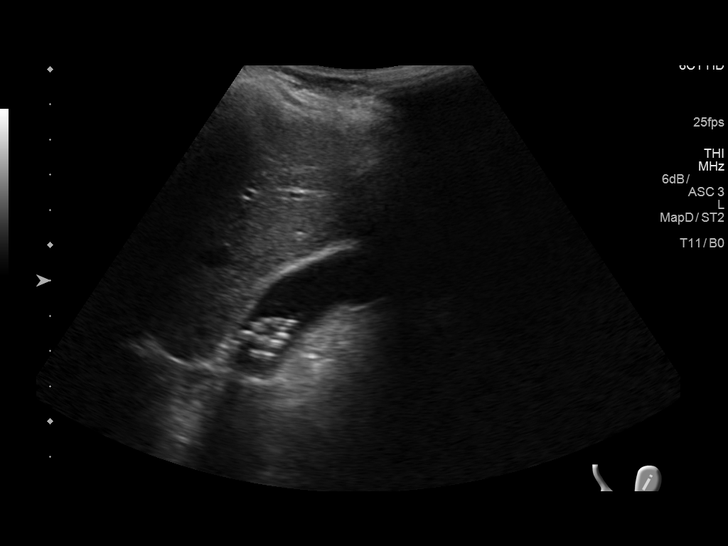
[im 4/42]
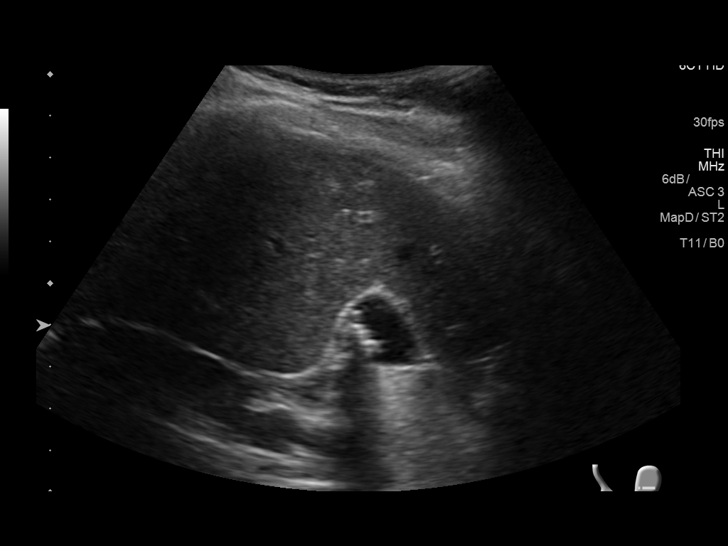
[im 7/42]
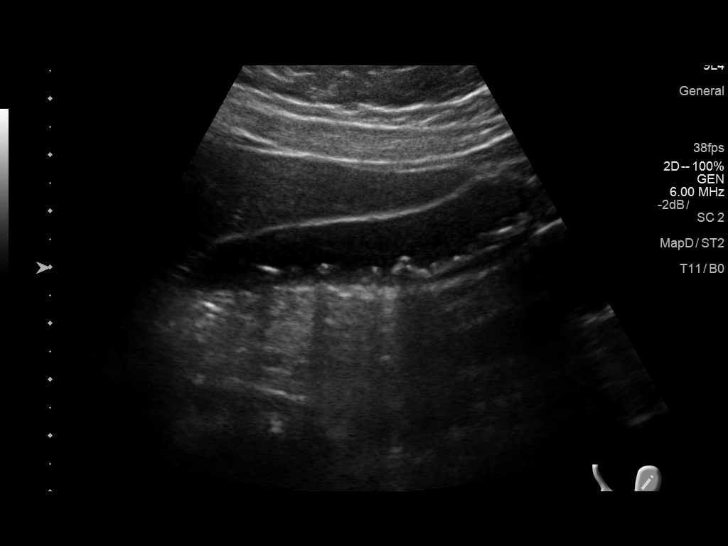
[im 11/42]
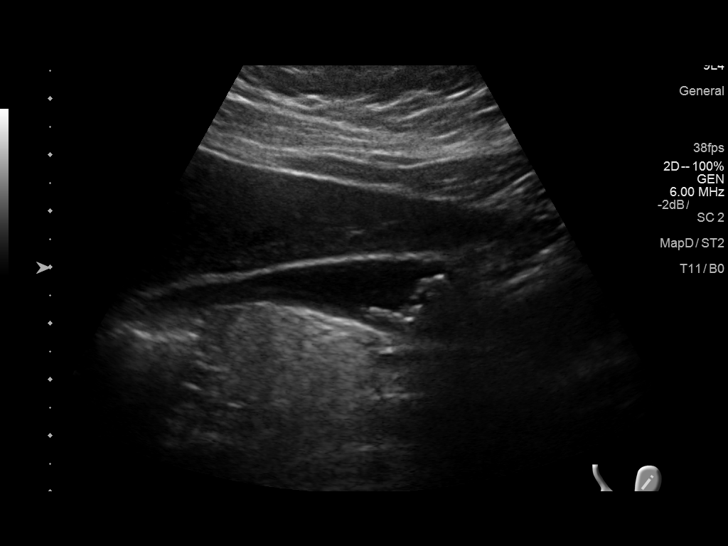
[im 14/42]
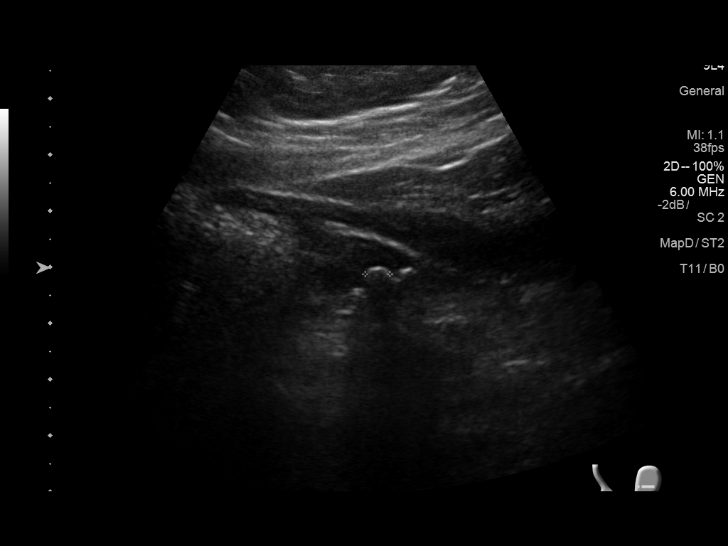
[im 16/42]
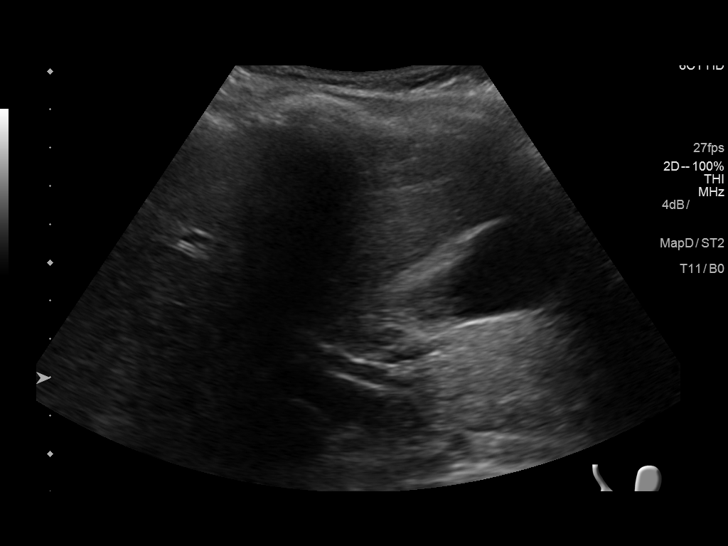
[im 19/42]
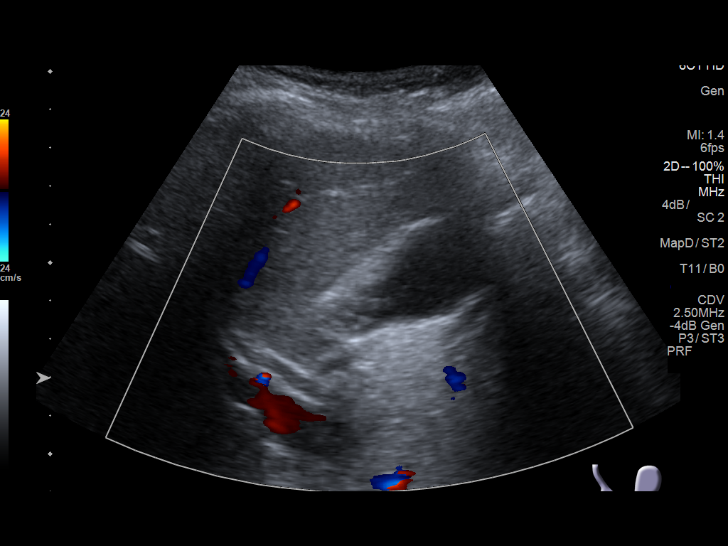
[im 23/42]
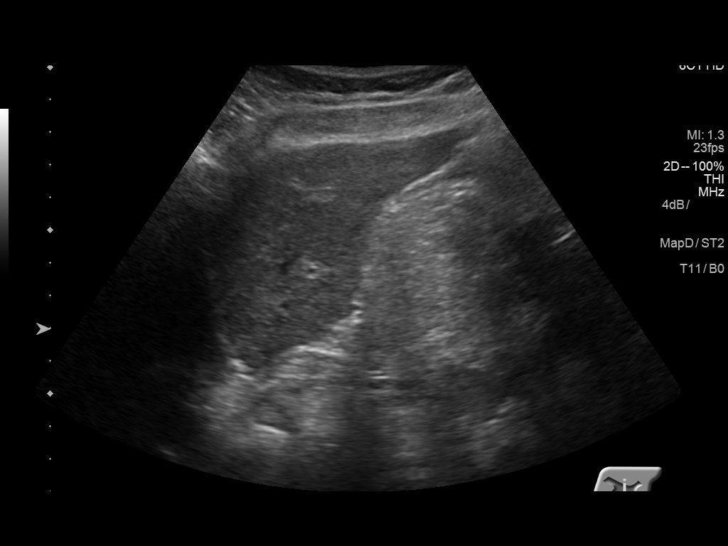
[im 26/42]
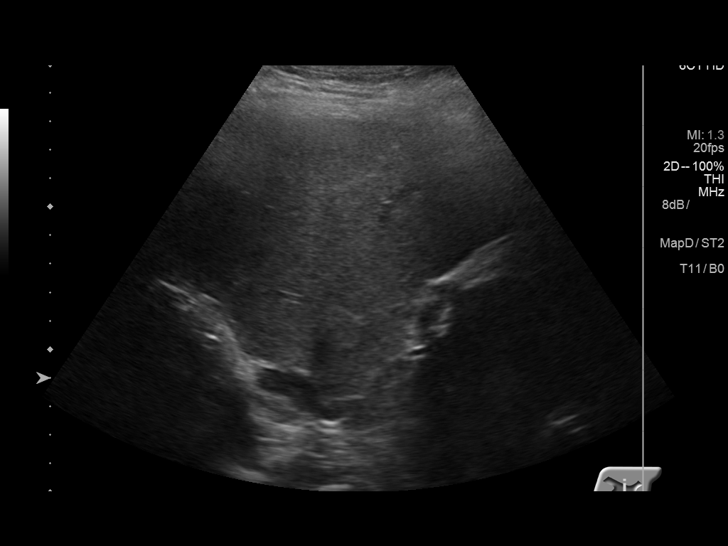
[im 28/42]
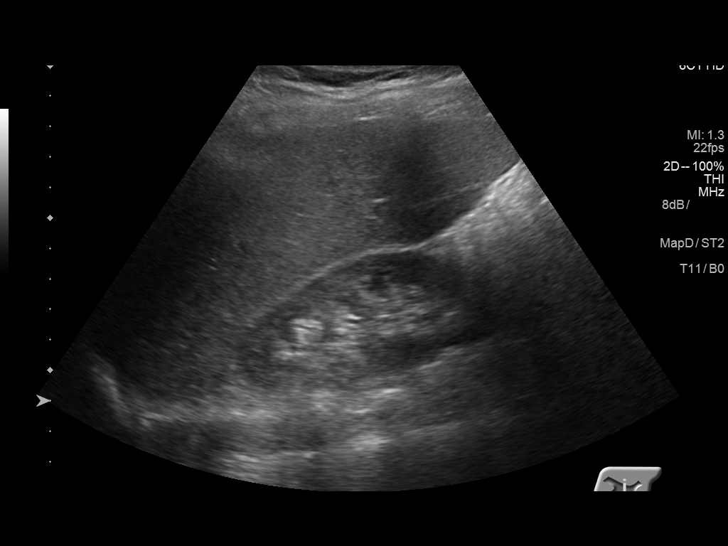
[im 31/42]
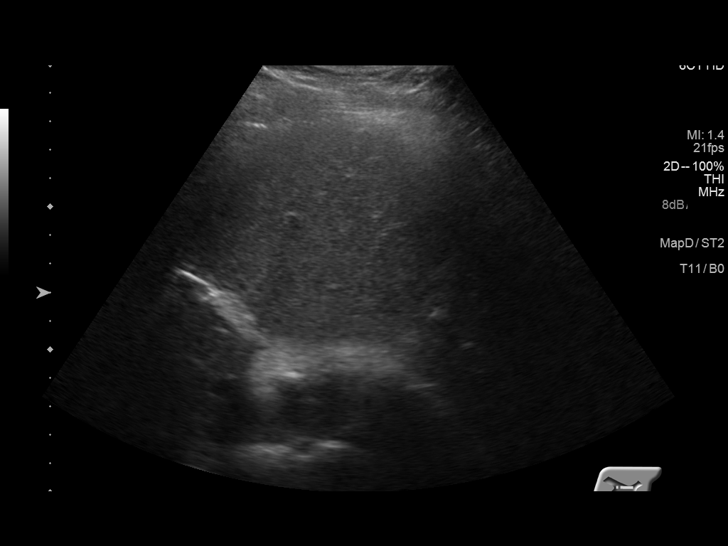
[im 35/42]
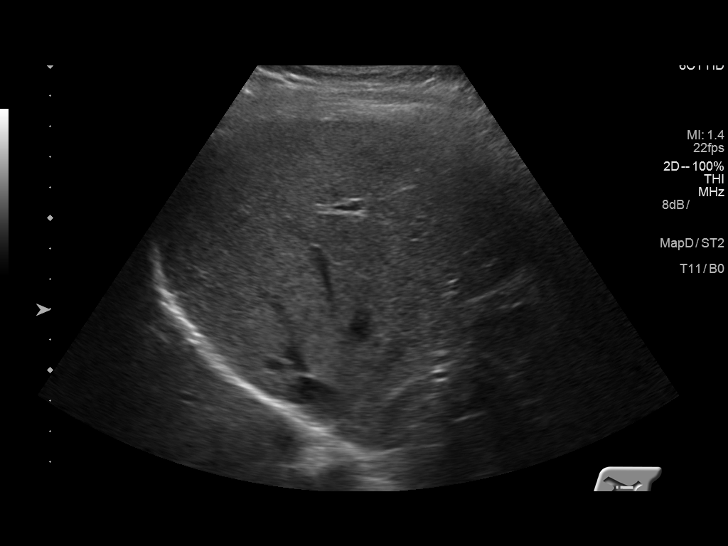
[im 38/42]
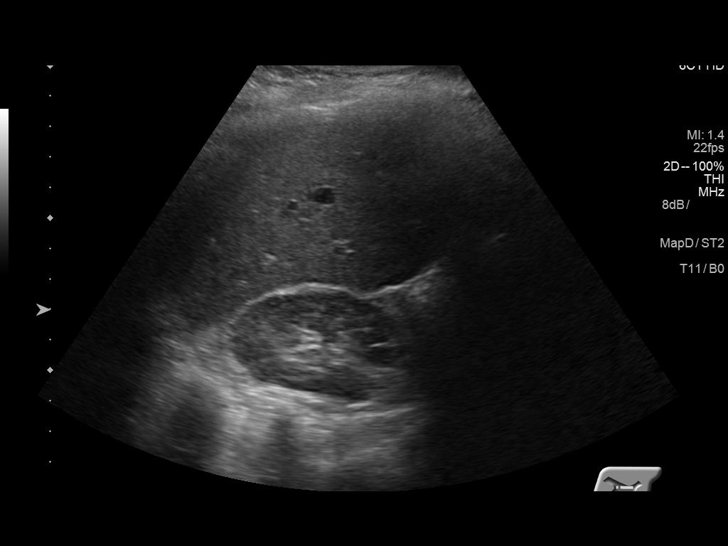
[im 42/42]
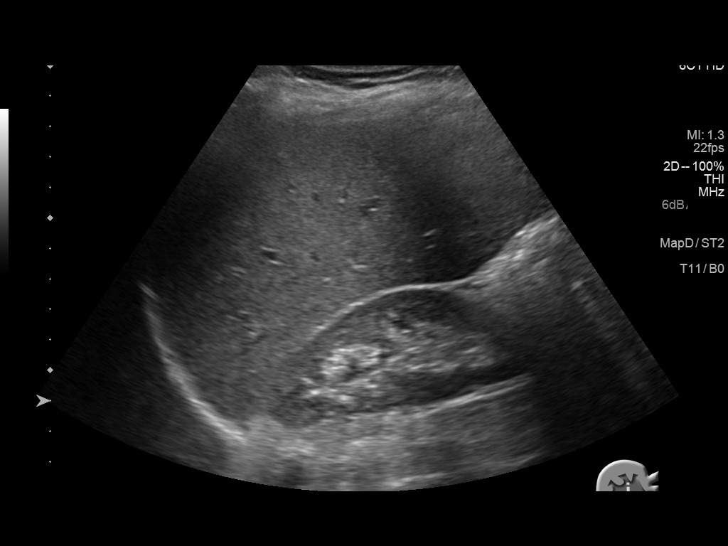

[14 of 25 positions shown; findings below may reference images not displayed]

FINDINGS: Gallbladder:

Multiple gallstones. The largest is 4 mm. No wall thickening or
Murphy's sign.

Common bile duct:

Diameter: 5 mm

Liver:

No focal lesion identified. Within normal limits in parenchymal
echogenicity.
IMPRESSION: Cholelithiasis.

## 2019-12-05 HISTORY — PX: AUGMENTATION MAMMAPLASTY: SUR837

## 2020-09-17 ENCOUNTER — Other Ambulatory Visit (HOSPITAL_COMMUNITY)
Admission: RE | Admit: 2020-09-17 | Discharge: 2020-09-17 | Disposition: A | Discharge status: Home / Self Care | Payer: 59 | Source: Ambulatory Visit | Attending: Obstetrics and Gynecology | Admitting: Obstetrics and Gynecology

## 2020-09-17 ENCOUNTER — Other Ambulatory Visit: Payer: Self-pay

## 2020-09-17 ENCOUNTER — Encounter: Payer: Self-pay | Admitting: Obstetrics and Gynecology

## 2020-09-17 ENCOUNTER — Ambulatory Visit (INDEPENDENT_AMBULATORY_CARE_PROVIDER_SITE_OTHER): Payer: BLUE CROSS/BLUE SHIELD | Admitting: Obstetrics and Gynecology

## 2020-09-17 VITALS — BP 108/70 | Ht 65.0 in | Wt 149.0 lb

## 2020-09-17 DIAGNOSIS — Z124 Encounter for screening for malignant neoplasm of cervix: Secondary | ICD-10-CM | POA: Diagnosis not present

## 2020-09-17 DIAGNOSIS — Z1331 Encounter for screening for depression: Secondary | ICD-10-CM | POA: Diagnosis not present

## 2020-09-17 DIAGNOSIS — Z01419 Encounter for gynecological examination (general) (routine) without abnormal findings: Secondary | ICD-10-CM

## 2020-09-17 DIAGNOSIS — Z1339 Encounter for screening examination for other mental health and behavioral disorders: Secondary | ICD-10-CM | POA: Diagnosis not present

## 2020-09-17 NOTE — Progress Notes (Signed)
Gynecology Annual Exam  PCP: Patient, No Pcp Per  Chief Complaint  Patient presents with  . Annual Exam    History of Present Illness:  [name] is a 29 y.o. [id number] who LMP was No LMP recorded. (Menstrual status: IUD)., presents today for her annual examination.  She has a Mirena IUD.  Her menses are absent with her IUD.  She is sexually active. No problems with intercourse. .  Last Pap: 5 years ago.  Results were: no abnormalities /neg HPV DNA not done Hx of STDs: none  There is no FH of breast cancer. There is no FH of ovarian cancer.   Tobacco use: The patient denies current or previous tobacco use. Alcohol use: social drinker Exercise: no  The patient wears seatbelts: yes.   The patient reports that domestic violence in her life is absent.   Recent breast surgery. So, patient would like to defer breast exam.   Past Medical History:  Diagnosis Date  . Abnormal Pap smear of cervix   . Arrhythmia    normal sinus rhthym on EKG-h/o palpitations-pt asymptomatic per pt during 11-19-15 phone interview    Past Surgical History:  Procedure Laterality Date  . BREAST SURGERY    . CHOLECYSTECTOMY N/A 11/24/2015   Procedure: LAPAROSCOPIC CHOLECYSTECTOMY WITH INTRAOPERATIVE CHOLANGIOGRAM;  Surgeon: Earline Mayotte, MD;  Location: [facility];  Service: General;  Laterality: N/A;  . WISDOM TOOTH EXTRACTION    . WISDOM TOOTH EXTRACTION  2010    Prior to Admission medications   Medication Sig Start Date End Date Taking? Authorizing Provider  levonorgestrel (MIRENA) 20 MCG/24HR IUD 1 each by Intrauterine route once.   Yes [provider]   Allergies: No Known Allergies  Obstetric History: [id number]  Social History   Socioeconomic History  . Marital status: Married    Spouse name: Not on file  . Number of children: Not on file  . Years of education: Not on file  . Highest education level: Not on file  Occupational History  . Not on file  Tobacco Use  .  Smoking status: Never Smoker  . Smokeless tobacco: Never Used  Vaping Use  . Vaping Use: Never used  Substance and Sexual Activity  . Alcohol use: No  . Drug use: No  . Sexual activity: Yes    Birth control/protection: I.U.D.  Other Topics Concern  . Not on file  Social History Narrative  . Not on file   Social Determinants of Health   Financial Resource Strain:   . Difficulty of Paying Living Expenses: Not on file  Food Insecurity:   . Worried About Programme researcher, broadcasting/film/video in the Last Year: Not on file  . Ran Out of Food in the Last Year: Not on file  Transportation Needs:   . Lack of Transportation (Medical): Not on file  . Lack of Transportation (Non-Medical): Not on file  Physical Activity:   . Days of Exercise per Week: Not on file  . Minutes of Exercise per Session: Not on file  Stress:   . Feeling of Stress : Not on file  Social Connections:   . Frequency of Communication with Friends and Family: Not on file  . Frequency of Social Gatherings with Friends and Family: Not on file  . Attends Religious Services: Not on file  . Active Member of Clubs or Organizations: Not on file  . Attends Banker Meetings: Not on file  . Marital Status: Not on file  Intimate Partner Violence:   . Fear of Current or Ex-Partner: Not on file  . Emotionally Abused: Not on file  . Physically Abused: Not on file  . Sexually Abused: Not on file    Family History  Problem Relation Age of Onset  . Lung cancer Paternal Grandfather   . Breast cancer Neg Hx   . Ovarian cancer Neg Hx     Review of Systems  Constitutional: Negative.   HENT: Negative.   Eyes: Negative.   Respiratory: Negative.   Cardiovascular: Negative.   Gastrointestinal: Negative.   Genitourinary: Negative.   Musculoskeletal: Negative.   Skin: Negative.   Neurological: Negative.   Psychiatric/Behavioral: Negative.      Physical Exam BP 108/70   Ht 5\' 5"  (1.651 m)   Wt 149 lb (67.6 kg)   BMI 24.79  kg/m    Physical Exam Constitutional:      General: She is not in acute distress.    Appearance: Normal appearance. She is well-developed.  Genitourinary:     Pelvic exam was performed with patient in the lithotomy position.     Vulva, urethra, bladder and uterus normal.     No inguinal adenopathy present in the right or left side.    No signs of injury in the vagina.     No vaginal discharge, erythema, tenderness or bleeding.     No cervical motion tenderness, discharge, lesion or polyp.     No IUD strings visualized.     Uterus is mobile.     Uterus is not enlarged or tender.     No uterine mass detected.    Uterus is anteverted.     No right or left adnexal mass present.     Right adnexa not tender or full.     Left adnexa not tender or full.  HENT:     Head: Normocephalic and atraumatic.  Eyes:     General: No scleral icterus.    Conjunctiva/sclera: Conjunctivae normal.  Neck:     Thyroid: No thyromegaly.  Cardiovascular:     Rate and Rhythm: Normal rate and regular rhythm.     Heart sounds: No murmur heard.  No friction rub. No gallop.   Pulmonary:     Effort: Pulmonary effort is normal. No respiratory distress.     Breath sounds: Normal breath sounds. No wheezing or rales.  Abdominal:     General: Bowel sounds are normal. There is no distension.     Palpations: Abdomen is soft. There is no mass.     Tenderness: There is no abdominal tenderness. There is no guarding or rebound.  Musculoskeletal:        General: No swelling or tenderness. Normal range of motion.     Cervical back: Normal range of motion and neck supple.  Lymphadenopathy:     Cervical: No cervical adenopathy.     Lower Body: No right inguinal adenopathy. No left inguinal adenopathy.  Neurological:     General: No focal deficit present.     Mental Status: She is alert and oriented to person, place, and time.     Cranial Nerves: No cranial nerve deficit.  Skin:    General: Skin is warm and dry.      Findings: No erythema or rash.  Psychiatric:        Mood and Affect: Mood normal.        Behavior: Behavior normal.        Judgment: Judgment normal.  Female chaperone present for pelvic and breast  portions of the physical exam  Results: AUDIT Questionnaire (screen for alcoholism): 3 PHQ-9: 4   Assessment: 29 y.o. G60P1002 female here for routine annual gynecologic examination  Plan: Problem List Items Addressed This Visit    None    Visit Diagnoses    Women's annual routine gynecological examination    -  Primary   Relevant Orders   Cytology - PAP   Screening for depression       Screening for alcoholism       Pap smear for cervical cancer screening       Relevant Orders   Cytology - PAP     Screening: -- Blood pressure screen normal -- Weight screening: normal -- Depression screening negative (PHQ-9) -- Nutrition: normal -- cholesterol screening: not due for screening -- osteoporosis screening: not due -- tobacco screening: not using -- alcohol screening: AUDIT questionnaire indicates low-risk usage. -- family history of breast cancer screening: done. not at high risk. -- no evidence of domestic violence or intimate partner violence. -- STD screening: gonorrhea/chlamydia NAAT not collected per patient request. -- pap smear collected per ASCCP guidelines  Thomasene Mohair, MD 09/17/2020 10:14 AM

## 2020-09-20 LAB — CYTOLOGY - PAP: Diagnosis: NEGATIVE

## 2021-09-19 ENCOUNTER — Other Ambulatory Visit: Payer: Self-pay

## 2021-09-19 ENCOUNTER — Encounter: Payer: Self-pay | Admitting: Obstetrics and Gynecology

## 2021-09-19 ENCOUNTER — Ambulatory Visit (INDEPENDENT_AMBULATORY_CARE_PROVIDER_SITE_OTHER): Payer: 59 | Admitting: Obstetrics and Gynecology

## 2021-09-19 ENCOUNTER — Other Ambulatory Visit (HOSPITAL_COMMUNITY)
Admission: RE | Admit: 2021-09-19 | Discharge: 2021-09-19 | Disposition: A | Discharge status: Home / Self Care | Payer: Self-pay | Source: Ambulatory Visit | Attending: Obstetrics and Gynecology | Admitting: Obstetrics and Gynecology

## 2021-09-19 VITALS — BP 126/84 | Ht 65.0 in | Wt 166.0 lb

## 2021-09-19 DIAGNOSIS — Z113 Encounter for screening for infections with a predominantly sexual mode of transmission: Secondary | ICD-10-CM | POA: Insufficient documentation

## 2021-09-19 DIAGNOSIS — R635 Abnormal weight gain: Secondary | ICD-10-CM

## 2021-09-19 DIAGNOSIS — R5383 Other fatigue: Secondary | ICD-10-CM

## 2021-09-19 DIAGNOSIS — Z1339 Encounter for screening examination for other mental health and behavioral disorders: Secondary | ICD-10-CM

## 2021-09-19 DIAGNOSIS — Z1331 Encounter for screening for depression: Secondary | ICD-10-CM | POA: Diagnosis not present

## 2021-09-19 DIAGNOSIS — Z0000 Encounter for general adult medical examination without abnormal findings: Secondary | ICD-10-CM

## 2021-09-19 DIAGNOSIS — Z01419 Encounter for gynecological examination (general) (routine) without abnormal findings: Secondary | ICD-10-CM

## 2021-09-19 NOTE — Progress Notes (Signed)
Gynecology Annual Exam  PCP: Patient, No Pcp Per (Inactive)  Chief Complaint  Patient presents with   Gynecologic Exam    History of Present Illness:  [name] is a 30 y.o. [id number] who LMP was No LMP recorded. (Menstrual status: IUD)., presents today for her annual examination.  Her menses are absent with the IUD.  She had the IUD placed 6 years ago.   She is sexually active. She denies pain with intercourse.  Last Pap: 1 year  Results were: no abnormalities /neg HPV DNA not done Hx of STDs: none  There is no FH of breast cancer. There is no FH of ovarian cancer. The patient does not do self-breast exams.  Tobacco use: The patient denies current or previous tobacco use. Alcohol use: social drinker Exercise: moderately active, 3-4 days per week.  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    She would like to have her thyroid test because her chiropractor recommended it.  She is feeling a lot of fatigue.  This has been going on for a while, maybe a year.  She has gained about 15 pounds over the past year.  She denies skin and hair changes. She denies new constipation.  She states that she feels well from a mood standpoint.  She is sleeping well.   The patient wears seatbelts: yes.   The patient reports that domestic violence in her life is absent.   Past Medical History:  Diagnosis Date   Abnormal Pap smear of cervix    Arrhythmia    normal sinus rhthym on EKG-h/o palpitations-pt asymptomatic per pt during 11-19-15 phone interview   Past Surgical History:  Procedure Laterality Date   BREAST SURGERY     CHOLECYSTECTOMY N/A 11/24/2015   Procedure: LAPAROSCOPIC CHOLECYSTECTOMY WITH INTRAOPERATIVE CHOLANGIOGRAM;  Surgeon: Earline Mayotte, MD;  Location: [facility];  Service: General;  Laterality: N/A;   WISDOM TOOTH EXTRACTION     WISDOM TOOTH EXTRACTION  2010    Prior to Admission medications   Medication Sig Start Date End Date Taking? Authorizing Provider  levonorgestrel  (MIRENA) 20 MCG/24HR IUD 1 each by Intrauterine route once.    [provider]   Allergies: No Known Allergies  Obstetric History: [id number]  Social History   Socioeconomic History   Marital status: Married    Spouse name: Not on file   Number of children: Not on file   Years of education: Not on file   Highest education level: Not on file  Occupational History   Not on file  Tobacco Use   Smoking status: Never   Smokeless tobacco: Never  Vaping Use   Vaping Use: Never used  Substance and Sexual Activity   Alcohol use: No   Drug use: No   Sexual activity: Yes    Birth control/protection: I.U.D.  Other Topics Concern   Not on file  Social History Narrative   Not on file   Social Determinants of Health   Financial Resource Strain: Not on file  Food Insecurity: Not on file  Transportation Needs: Not on file  Physical Activity: Not on file  Stress: Not on file  Social Connections: Not on file  Intimate Partner Violence: Not on file    Family History  Problem Relation Age of Onset   Lung cancer Paternal Grandfather    Breast cancer Neg Hx    Ovarian cancer Neg Hx     Review of Systems  Constitutional: Negative.   HENT: Negative.  Eyes: Negative.   Respiratory: Negative.    Cardiovascular: Negative.   Gastrointestinal: Negative.   Genitourinary: Negative.   Musculoskeletal: Negative.   Skin: Negative.   Neurological: Negative.   Psychiatric/Behavioral: Negative.      Physical Exam BP 126/84   Ht 5\' 5"  (1.651 m)   Wt 166 lb (75.3 kg)   BMI 27.62 kg/m   Physical Exam Constitutional:      General: She is not in acute distress.    Appearance: Normal appearance. She is well-developed.  Genitourinary:     Vulva and bladder normal.     Right Labia: No rash, tenderness, lesions, skin changes or Bartholin's cyst.    Left Labia: No tenderness, lesions, skin changes, Bartholin's cyst or rash.    No inguinal adenopathy present in the right or left  side.    Pelvic Tanner Score: 5/5.    No vaginal discharge, erythema, tenderness or bleeding.      Right Adnexa: not tender, not full and no mass present.    Left Adnexa: not tender, not full and no mass present.    No cervical motion tenderness, discharge, lesion or polyp.     No IUD strings visualized.     Uterus is not enlarged or tender.     No uterine mass detected.    Pelvic exam was performed with patient in the lithotomy position.  Breasts:    Right: No inverted nipple, mass, nipple discharge, skin change or tenderness.     Left: No inverted nipple, mass, nipple discharge, skin change or tenderness.  HENT:     Head: Normocephalic and atraumatic.  Eyes:     General: No scleral icterus.    Conjunctiva/sclera: Conjunctivae normal.  Neck:     Thyroid: No thyromegaly.  Cardiovascular:     Rate and Rhythm: Normal rate and regular rhythm.     Heart sounds: No murmur heard.   No friction rub. No gallop.  Pulmonary:     Effort: Pulmonary effort is normal. No respiratory distress.     Breath sounds: Normal breath sounds. No wheezing or rales.  Abdominal:     General: Bowel sounds are normal. There is no distension.     Palpations: Abdomen is soft. There is no mass.     Tenderness: There is no abdominal tenderness. There is no guarding or rebound.     Hernia: There is no hernia in the left inguinal area or right inguinal area.  Musculoskeletal:        General: No swelling or tenderness. Normal range of motion.     Cervical back: Normal range of motion and neck supple.  Lymphadenopathy:     Cervical: No cervical adenopathy.     Lower Body: No right inguinal adenopathy. No left inguinal adenopathy.  Neurological:     General: No focal deficit present.     Mental Status: She is alert and oriented to person, place, and time.     Cranial Nerves: No cranial nerve deficit.  Skin:    General: Skin is warm and dry.     Findings: No erythema or rash.  Psychiatric:        Mood and  Affect: Mood normal.        Behavior: Behavior normal.        Judgment: Judgment normal.    Female chaperone present for pelvic and breast  portions of the physical exam  Results: AUDIT Questionnaire (screen for alcoholism): 3 PHQ-9: 8 (zero on self-harm)   Assessment:  30 y.o. G25P1002 female here for routine annual gynecologic examination  Plan: Problem List Items Addressed This Visit   None Visit Diagnoses     Women's annual routine gynecological examination    -  Primary   Relevant Orders   TSH   T4, free   T3, free   Cervicovaginal ancillary only   Screening for depression       Screening for alcoholism       Fatigue, unspecified type       Relevant Orders   TSH   T4, free   T3, free   Weight gain       Relevant Orders   TSH   T4, free   T3, free   Blood tests for routine general physical examination       Relevant Orders   TSH   T4, free   T3, free   Screen for STD (sexually transmitted disease)       Relevant Orders   Cervicovaginal ancillary only       Screening: -- Blood pressure screen normal -- Weight screening: normal -- Depression screening negative (PHQ-9) -- Nutrition: normal -- cholesterol screening: not due for screening -- osteoporosis screening: not due -- tobacco screening: not using -- alcohol screening: AUDIT questionnaire indicates low-risk usage. -- family history of breast cancer screening: done. not at high risk. -- no evidence of domestic violence or intimate partner violence. -- STD screening: gonorrhea/chlamydia NAAT collected -- pap smear not collected per ASCCP guidelines -- flu vaccine  declines -- HPV vaccination series:  declined  Weight gain, lethargy: will get thyroid panel.  Thomasene Mohair, MD 09/19/2021 2:08 PM

## 2021-09-20 LAB — T4, FREE: Free T4: 0.95 ng/dL (ref 0.82–1.77)

## 2021-09-20 LAB — TSH: TSH: 1.26 u[IU]/mL (ref 0.450–4.500)

## 2021-09-20 LAB — T3, FREE: T3, Free: 2.9 pg/mL (ref 2.0–4.4)

## 2021-09-21 LAB — CERVICOVAGINAL ANCILLARY ONLY
Chlamydia: NEGATIVE
Comment: NEGATIVE
Comment: NEGATIVE
Comment: NORMAL
Neisseria Gonorrhea: NEGATIVE
Trichomonas: NEGATIVE

## 2023-05-09 ENCOUNTER — Other Ambulatory Visit: Payer: Self-pay | Admitting: Adult Health

## 2023-05-09 DIAGNOSIS — N63 Unspecified lump in unspecified breast: Secondary | ICD-10-CM

## 2023-05-23 ENCOUNTER — Other Ambulatory Visit: Payer: Self-pay | Admitting: Adult Health

## 2023-05-23 ENCOUNTER — Ambulatory Visit
Admission: RE | Admit: 2023-05-23 | Discharge: 2023-05-23 | Disposition: A | Discharge status: Home / Self Care | Payer: Managed Care, Other (non HMO) | Source: Ambulatory Visit | Attending: Adult Health | Admitting: Adult Health

## 2023-05-23 DIAGNOSIS — N6315 Unspecified lump in the right breast, overlapping quadrants: Secondary | ICD-10-CM | POA: Diagnosis present

## 2023-05-23 DIAGNOSIS — Z803 Family history of malignant neoplasm of breast: Secondary | ICD-10-CM | POA: Diagnosis not present

## 2023-05-23 DIAGNOSIS — N63 Unspecified lump in unspecified breast: Secondary | ICD-10-CM

## 2024-08-14 ENCOUNTER — Ambulatory Visit

## 2024-08-14 DIAGNOSIS — W908XXA Exposure to other nonionizing radiation, initial encounter: Secondary | ICD-10-CM | POA: Diagnosis not present

## 2024-08-14 DIAGNOSIS — Z1283 Encounter for screening for malignant neoplasm of skin: Secondary | ICD-10-CM | POA: Diagnosis not present

## 2024-08-14 DIAGNOSIS — D1801 Hemangioma of skin and subcutaneous tissue: Secondary | ICD-10-CM

## 2024-08-14 DIAGNOSIS — D229 Melanocytic nevi, unspecified: Secondary | ICD-10-CM

## 2024-08-14 DIAGNOSIS — L811 Chloasma: Secondary | ICD-10-CM

## 2024-08-14 DIAGNOSIS — L814 Other melanin hyperpigmentation: Secondary | ICD-10-CM

## 2024-08-14 DIAGNOSIS — L821 Other seborrheic keratosis: Secondary | ICD-10-CM

## 2024-08-14 DIAGNOSIS — L570 Actinic keratosis: Secondary | ICD-10-CM | POA: Diagnosis not present

## 2024-08-14 DIAGNOSIS — L578 Other skin changes due to chronic exposure to nonionizing radiation: Secondary | ICD-10-CM

## 2024-08-14 DIAGNOSIS — L700 Acne vulgaris: Secondary | ICD-10-CM

## 2024-08-14 DIAGNOSIS — I781 Nevus, non-neoplastic: Secondary | ICD-10-CM

## 2024-08-14 MED ORDER — AZELAIC ACID 15 % EX GEL: CUTANEOUS | 5 refills | Status: AC

## 2024-08-14 MED ORDER — CLINDAMYCIN PHOSPHATE 1 % EX SWAB: CUTANEOUS | 5 refills | Status: AC

## 2024-08-14 MED ORDER — TRETINOIN 0.025 % EX CREA: TOPICAL_CREAM | Freq: Every day | CUTANEOUS | 5 refills | Status: AC

## 2024-08-14 NOTE — Progress Notes (Signed)
 Subjective   Hannah Schaefer is a 33 y.o. female who presents for the following: Total body skin exam for skin cancer screening and mole check. The patient has spots, moles and lesions to be evaluated, some may be new or changing and the patient may have concern these could be cancer.. Patient is new patient  Today patient reports: One area of concern on her left cheek that she noticed has been changing since August 28th. Notes scale and redness  Melasma on face. Acne on face and chest, not using any products.   Review of Systems:    No other skin or systemic complaints except as noted in HPI or Assessment and Plan.  The following portions of the chart were reviewed this encounter and updated as appropriate: medications, allergies, medical history  Relevant Medical History:  n/a   Objective  Well appearing patient in no apparent distress; mood and affect are within normal limits. Examination was performed of the: Full Skin Examination: scalp, head, eyes, ears, nose, lips, neck, chest, axillae, abdomen, back, buttocks, bilateral upper extremities, bilateral lower extremities, hands, feet, fingers, toes, fingernails, and toenails.   Examination notable for: Angioma(s): Scattered red vascular papule(s)  , Lentigo/lentigines: Scattered pigmented macules that are tan to brown in color and are somewhat non-uniform in shape and concentrated in the sun-exposed areas, Nevus/nevi: Scattered well-demarcated, regular, pigmented macule(s) and/or papule(s)  , Acne vulgaris: Scattered open and closed comedones on the back and face. Red, inflammatory papules and pustules on face and back  - Hyperpigmented macules and patches with coalescence located symmetrically on the cheeks and forehead  - Mottled hyper- and hypopigmentation with some background erythema and telangiectasia formation over the lateral neck and V of the chest, face  Examination limited by: Undergarments and Patient deferred removal      Left Malar Cheek Pink scaly macules  Assessment & Plan   SKIN CANCER SCREENING PERFORMED TODAY.  BENIGN SKIN FINDINGS  - Lentigines  - Seborrheic keratoses  - Hemangiomas   - Nevus/Multiple Benign Nevi - Reassurance provided regarding the benign appearance of lesions noted on exam today; no treatment is indicated in the absence of symptoms/changes. - Reinforced importance of photoprotective strategies including liberal and frequent sunscreen use of a broad-spectrum SPF 30 or greater, use of protective clothing, and sun avoidance for prevention of cutaneous malignancy and photoaging.  Counseled patient on the importance of regular self-skin monitoring as well as routine clinical skin examinations as scheduled.   ACTINIC DAMAGE - Chronic condition, secondary to cumulative UV/sun exposure - Recommend daily broad spectrum sunscreen SPF 30+ to sun-exposed areas, reapply every 2 hours as needed.  - Staying in the shade or wearing long sleeves, sun glasses (UVA+UVB protection) and wide brim hats (4-inch brim around the entire circumference of the hat) are also recommended for sun protection.  - Call for new or changing lesions.  Acne vulgaris - mild, Chest > Back  - Chronic and persistent condition with duration or expected duration over one year. Condition is symptomatic and bothersome to patient. Patient is flaring and not currently at treatment goal.  - Discussed various treatment options with patient, as well as need for consistent use for at least 6-12 weeks for full efficacy.  - Reviewed treatment options, including side effects of topical agents, oral antibiotics, OCPs (if female), oral spironolactone (if female), and isotretinoin. Discussed that isotretinoin is the most effective  - After discussion opted to initiate: start tretinoin  0.025% cream in the evening. Educated  patient about proper use and potential side effects, including dryness, irritation, sun sensitivity, and transient  worsening of acne. Start topical clindamycin  swabs 1% once daily to active areas    Melasma - explained the etiology of the disorder, its difficulty to treat, and chronic nature - Strict sun protection with sun avoidance and tinted broad-spectrum sunscreen (with visible light protection), ideally SPF 50+, but at least 30+ (provided handout with recommendations). Warned that even 10 minutes of unprotected sun exposure while clear can flare melasma - Counseled that this is a chronic condition that will likely resolve in later years of life - Counseled on goals of treatment - primarily prevention of flares and minimizing pigmentation, not necessarily complete clearance - recommended diligent sun protection - start tretinoin  0.025% as above  - start SM75 Tranexamic Acid 8% / Azelaic Acid  10% / Niacinamide 2% Cream  - start Triluma (tretinoin , hydroquinone, fluocinolone) every night to the dark areas, followed by Retinol  - Recommended Eucerin thiamidol   Lentigines, telangiectasias face and neck - Discussed laser   Level of service outlined above   Procedures, orders, diagnosis for this visit:  ACTINIC KERATOSIS Left Malar Cheek Actinic keratoses are precancerous spots that appear secondary to cumulative UV radiation exposure/sun exposure over time. They are chronic with expected duration over 1 year. A portion of actinic keratoses will progress to squamous cell carcinoma of the skin. It is not possible to reliably predict which spots will progress to skin cancer and so treatment is recommended to prevent development of skin cancer.  Recommend daily broad spectrum sunscreen SPF 30+ to sun-exposed areas, reapply every 2 hours as needed.  Recommend staying in the shade or wearing long sleeves, sun glasses (UVA+UVB protection) and wide brim hats (4-inch brim around the entire circumference of the hat). Call for new or changing lesions. Destruction of lesion - Left Malar Cheek Complexity: simple    Destruction method: cryotherapy   Informed consent: discussed and consent obtained   Timeout:  patient name, date of birth, surgical site, and procedure verified Lesion destroyed using liquid nitrogen: Yes   Region frozen until ice ball extended beyond lesion: Yes   Cryo cycles: 1 or 2. Outcome: patient tolerated procedure well with no complications   Post-procedure details: wound care instructions given   Additional details:  Prior to procedure, discussed risks of blister formation, small wound, skin dyspigmentation, or rare scar following cryotherapy. Recommend Vaseline ointment to treated areas while healing.   SKIN EXAM FOR MALIGNANT NEOPLASM   LENTIGO   SEBORRHEIC KERATOSIS   CHERRY ANGIOMA   MULTIPLE BENIGN NEVI   ACTINIC SKIN DAMAGE   MELASMA   ACNE VULGARIS    Actinic keratosis -     Destruction of lesion  Skin exam for malignant neoplasm  Lentigo  Seborrheic keratosis  Cherry angioma  Multiple benign nevi  Actinic skin damage  Melasma  Acne vulgaris  Other orders -     Tretinoin ; Apply topically at bedtime. Apply 2-3 times weekly at night to dry skin after cleaning. Increase frequency up to nightly as tolerated.  Dispense: 20 g; Refill: 5 -     Clindamycin  Phosphate; Apply to the affected area of skin once daily  Dispense: 60 each; Refill: 5 -     Azelaic Acid ; After skin is thoroughly washed and patted dry, gently but thoroughly massage a thin film of azelaic acid  gel into the affected area twice daily, in the morning and evening.  Dispense: 50 g; Refill: 5  Return to clinic: Return in about 4 months (around 12/14/2024) for Acne.  Documentation:  I, Emerick Ege, CMA am acting as scribe for Lauraine JAYSON Kanaris, MD   I have reviewed the above documentation for accuracy and completeness, and I agree with the above.  Lauraine JAYSON Kanaris, MD

## 2024-08-14 NOTE — Patient Instructions (Addendum)
 Instructions for [facility] Medications  One or more of your medications was sent to the [facility] mail order compounding pharmacy. You will receive an email from them and can purchase the medicine through that link. It will then be mailed to your home at the address you confirmed. If for any reason you do not receive an email from them, please check your spam folder. If you still do not find the email, please let us  know. [facility] phone number is [phone].    Plan for Acne  In the morning: - Cleanse face with a gentle cleanser OR benzoyl peroxide wash if instructed to use this at your visit(can be purchased over the counter- examples at the bottom) - Wipe face with clindamycin  wipe if prescribed at your visit. This can be used on entire face/chest/back or as a spot treatment to active acne areas  - Apply an oil-free moisturizer  In the evening: - Cleanse face with a regular gentle face wash - Wait for skin to completely dry - Apply a pea sized amount of your retinoid (tretinoin  or adapalene) to your finger. Dot this around your face, and then rub in - If your skin gets dry, you can follow this up with an oil-free moisturizer  If you were instructed to take minocycline or doxycycline. This is the antibiotic we talked about that you will take twice per day for a 3 month course. Take the medication with food, and don't lie down right after taking it, because it can give you heart burn if you lie down right away.  How to use your Acne creams  Some creams for acne PREVENT acne and some creams TARGET pimples you can see.  Antibiotic creams (erythromycin, benzoyl peroxide, clindamycin  and sulfur sulfacetamide)  How to apply: generally applied once daily either as a spot treatment or all over the face (see above)  Retinoids (differin/adapalene, retin-A /tretinoin  or tazorac) work by PREVENTING acne.  These medicines are good to control acne, but may cause dryness and  increase your risk of sunburn.  Do not use if you are PREGNANT or BREAST FEEDING. It takes 4-6 weeks to have results and the acne may worsen in the beginning.  Some retinoids are stronger than others, but are also more irritating. These are prescription topical medications, used to treat acne, sun damage, fine wrinkles, and several other skin changes on the face. Medications in this family include tretinoin /Retin-A , adapalene/Differin, and Tazorac, among others.   A Note on Cosmetic Use: - Please note, if you are over the age of 43, insurance will typically not cover these medications. If they are recommended to you for non-medically necessary reasons, you may choose to pay for the medication out of pocket. Prices vary, but a tube of generic tretinoin  can often be purchased for ~$60-100 (this size often lasts several months). This varies considerably, and we recommended that you check www.goodrx.com for prescription discount coupons and to compare prices across pharmacies.   How to apply: Use at night time (sunlight makes them inactive). If you wash your face at night, let the skin dry for 20 minutes before applying. Apply to all areas that have breakouts (NOT just to pimples you see). Use a PEA-SIZED amount for the entire face (no more) Dot it on your skin and connect the dots Avoid eyes and lips These may cause irritation in the beginning. To decrease irritation, do the following: 1st month: Use twice a week for the first month  2nd month: Use every  other night 3rd month and on: Use every night  Cleansing your skin: -   Do not use harsh "acne" soaps and astringents. - Use water to clean your face.  - If you wear make-up, sunscreen or creams, use non-comedogenic (non-pore clogging) gentle moisturizing wash or cream. Examples include: Cetaphil, Neutrogena, Clinique  Moisturizers and sunscreen: Apply a "non-comedogenic" (non-pore clogging) lotion with sunscreen in the morning. You may need  to re-apply during the day. Neutrogena, Eucerin, Clinique, Vanicream    If you have dryness or irritation, try these tips: Decrease use to every other night or twice weekly as tolerated  Wash the retinoid off after 1 hour and apply a "non-comedogenic" (non-pore-clogging) lotion If dryness or irritation continues, stop using the retinoid.   Benzoyl peroxide washes 3-5% (brand name includes Neutragena Clear Pore, CereVe Acne Foaming Cream Cleanser, Differin Cleanser) that you use in the shower. Can bleach linens (clothes, towels, etc), will NOT bleach your skin or hair). Dry off with a white towel so it doesn't take the color out of clothing and colored towels.      Recommended sunscreens for face for patients with melasma: - La Roche-Posay Anthelios Mineral SPF 50 Sunscreen Tinted - SkinCeuticals Physical Fusion UV Defense Tinted Mineral - EltaMD UV Physical Tinted Facial Suncreen SPF 41 - Avene Mineral High Protection Tinted Compact SPF 50 -- this comes in darker tints     Melanoma ABCDEs  Melanoma is the most dangerous type of skin cancer, and is the leading cause of death from skin disease.  You are more likely to develop melanoma if you: Have light-colored skin, light-colored eyes, or red or blond hair Spend a lot of time in the sun Tan regularly, either outdoors or in a tanning bed Have had blistering sunburns, especially during childhood Have a close family member who has had a melanoma Have atypical moles or large birthmarks  Early detection of melanoma is key since treatment is typically straightforward and cure rates are extremely high if we catch it early.   The first sign of melanoma is often a change in a mole or a new dark spot.  The ABCDE system is a way of remembering the signs of melanoma.  A for asymmetry:  The two halves do not match. B for border:  The edges of the growth are irregular. C for color:  A mixture of colors are present instead of an even brown  color. D for diameter:  Melanomas are usually (but not always) greater than 6mm - the size of a pencil eraser. E for evolution:  The spot keeps changing in size, shape, and color.  Please check your skin once per month between visits. You can use a small mirror in front and a large mirror behind you to keep an eye on the back side or your body.   If you see any new or changing lesions before your next follow-up, please call to schedule a visit.  Please continue daily skin protection including broad spectrum sunscreen SPF 30+ to sun-exposed areas, reapplying every 2 hours as needed when you're outdoors.     Due to recent changes in healthcare laws, you may see results of your pathology and/or laboratory studies on MyChart before the doctors have had a chance to review them. We understand that in some cases there may be results that are confusing or concerning to you. Please understand that not all results are received at the same time and often the doctors may need to interpret  multiple results in order to provide you with the best plan of care or course of treatment. Therefore, we ask that you please give us  2 business days to thoroughly review all your results before contacting the office for clarification. Should we see a critical lab result, you will be contacted sooner.   If You Need Anything After Your Visit  If you have any questions or concerns for your doctor, please call our main line at [phone] and press option 4 to reach your doctor's medical assistant. If no one answers, please leave a voicemail as directed and we will return your call as soon as possible. Messages left after 4 pm will be answered the following business day.   You may also send us  a message via MyChart. We typically respond to MyChart messages within 1-2 business days.  For prescription refills, please ask your pharmacy to contact our office. Our fax number is [phone].  If you have an urgent issue when the  clinic is closed that cannot wait until the next business day, you can page your doctor at the number below.    Please note that while we do our best to be available for urgent issues outside of office hours, we are not available 24/7.   If you have an urgent issue and are unable to reach us , you may choose to seek medical care at your doctor's office, retail clinic, urgent care center, or emergency room.  If you have a medical emergency, please immediately call 911 or go to the emergency department.  Pager Numbers  - Dr. Hester: [phone]  - Dr. Jackquline: [phone]  - Dr. Claudene: [phone]   - Dr. Raymund: [phone]  In the event of inclement weather, please call our main line at [phone] for an update on the status of any delays or closures.  Dermatology Medication Tips: Please keep the boxes that topical medications come in in order to help keep track of the instructions about where and how to use these. Pharmacies typically print the medication instructions only on the boxes and not directly on the medication tubes.   If your medication is too expensive, please contact our office at [phone] option 4 or send us  a message through MyChart.   We are unable to tell what your co-pay for medications will be in advance as this is different depending on your insurance coverage. However, we may be able to find a substitute medication at lower cost or fill out paperwork to get insurance to cover a needed medication.   If a prior authorization is required to get your medication covered by your insurance company, please allow us  1-2 business days to complete this process.  Drug prices often vary depending on where the prescription is filled and some pharmacies may offer cheaper prices.  The website www.goodrx.com contains coupons for medications through different pharmacies. The prices here do not account for what the cost may be with help from insurance (it may be cheaper  with your insurance), but the website can give you the price if you did not use any insurance.  - You can print the associated coupon and take it with your prescription to the pharmacy.  - You may also stop by our office during regular business hours and pick up a GoodRx coupon card.  - If you need your prescription sent electronically to a different pharmacy, notify our office through [facility] or by phone at [phone] option 4.     Si Usted [employer]  Algo Despus de Su Visita  Tambin puede enviarnos un mensaje a travs de MyChart. Por lo general respondemos a los mensajes de MyChart en el transcurso de 1 a 2 das hbiles.  Para renovar recetas, por favor pida a su farmacia que se ponga en contacto con nuestra oficina. Randi lakes de fax es [place] [phone].  Si tiene un asunto urgente cuando la clnica est cerrada y que no puede esperar hasta el siguiente da hbil, puede llamar/localizar a su doctor(a) al nmero que aparece a continuacin.   Por favor, tenga en cuenta que aunque hacemos todo lo posible para estar disponibles para asuntos urgentes fuera del horario de [place], no estamos disponibles las 24 horas del da, los 7 [address]  [address] de la [place].   Si tiene un problema urgente y no puede comunicarse con nosotros, puede optar por buscar atencin mdica  en el consultorio de su doctor(a), en una clnica privada, en un centro de atencin urgente o en una sala de emergencias.  Si tiene Engineer, drilling, por favor llame inmediatamente al 911 o vaya a la sala de emergencias.  Nmeros de bper  - Dr. Hester: [phone]  - Dra. Jackquline: [phone]  - Dr. Claudene: [phone]  - Dra. Kitts: [phone]  En caso de inclemencias del [place], por favor llame a nuestra lnea principal al [phone] para una actualizacin sobre el estado de cualquier retraso o cierre.  Consejos para la medicacin en dermatologa: Por favor, guarde las cajas en las que vienen los  medicamentos de uso tpico para ayudarle a seguir las instrucciones sobre dnde y cmo usarlos. Las farmacias generalmente imprimen las instrucciones del medicamento slo en las cajas y no directamente en los tubos del [place].   Si su medicamento es muy caro, por favor, pngase en contacto con landry rieger llamando al [phone] y presione la opcin 4 o envenos un mensaje a travs de Clinical cytogeneticist.   No podemos decirle cul ser su copago por los medicamentos por adelantado ya que esto es diferente dependiendo de la cobertura de su seguro. Sin embargo, es posible que podamos encontrar un medicamento sustituto a Audiological scientist un formulario para que el seguro cubra el medicamento que se considera necesario.   Si se requiere una autorizacin previa para que su compaa de seguros malta su medicamento, por favor permtanos de 1 a 2 das hbiles para completar este proceso.  Los precios de los medicamentos varan con frecuencia dependiendo del Environmental consultant de dnde se surte la receta y alguna farmacias pueden ofrecer precios ms baratos.  El sitio web www.goodrx.com tiene cupones para medicamentos de Health and safety inspector. Los precios aqu no tienen en cuenta lo que podra costar con la ayuda del seguro (puede ser ms barato con su seguro), pero el sitio web puede darle el precio si no utiliz Tourist information centre manager.  - Puede imprimir el cupn correspondiente y llevarlo con su receta a la farmacia.  - Tambin puede pasar por nuestra oficina durante el horario de atencin regular y Education officer, museum una tarjeta de cupones de GoodRx.  - Si necesita que su receta se enve electrnicamente a una farmacia diferente, informe a nuestra oficina a travs de MyChart de [place] o por telfono llamando al [phone] y presione la opcin 4.

## 2024-12-15 ENCOUNTER — Ambulatory Visit
